# Patient Record
Sex: Female | Born: 1935 | Race: White | Hispanic: No | Marital: Married | Smoking: Never smoker
Health system: Southern US, Community
[De-identification: ages and names within clinical notes are randomized; demographics above are authoritative.]

## PROBLEM LIST (undated history)

## (undated) DIAGNOSIS — K449 Diaphragmatic hernia without obstruction or gangrene: Secondary | ICD-10-CM

## (undated) DIAGNOSIS — K8050 Calculus of bile duct without cholangitis or cholecystitis without obstruction: Secondary | ICD-10-CM

## (undated) DIAGNOSIS — I10 Essential (primary) hypertension: Secondary | ICD-10-CM

## (undated) DIAGNOSIS — Z86010 Personal history of colonic polyps: Secondary | ICD-10-CM

## (undated) DIAGNOSIS — H9190 Unspecified hearing loss, unspecified ear: Secondary | ICD-10-CM

## (undated) DIAGNOSIS — E538 Deficiency of other specified B group vitamins: Secondary | ICD-10-CM

## (undated) DIAGNOSIS — R413 Other amnesia: Secondary | ICD-10-CM

## (undated) DIAGNOSIS — R269 Unspecified abnormalities of gait and mobility: Principal | ICD-10-CM

## (undated) DIAGNOSIS — I2510 Atherosclerotic heart disease of native coronary artery without angina pectoris: Secondary | ICD-10-CM

## (undated) DIAGNOSIS — IMO0002 Reserved for concepts with insufficient information to code with codable children: Secondary | ICD-10-CM

## (undated) DIAGNOSIS — K5790 Diverticulosis of intestine, part unspecified, without perforation or abscess without bleeding: Secondary | ICD-10-CM

## (undated) DIAGNOSIS — Z860101 Personal history of adenomatous and serrated colon polyps: Secondary | ICD-10-CM

## (undated) DIAGNOSIS — H532 Diplopia: Secondary | ICD-10-CM

## (undated) DIAGNOSIS — E785 Hyperlipidemia, unspecified: Secondary | ICD-10-CM

## (undated) DIAGNOSIS — M810 Age-related osteoporosis without current pathological fracture: Secondary | ICD-10-CM

## (undated) DIAGNOSIS — I471 Supraventricular tachycardia, unspecified: Secondary | ICD-10-CM

## (undated) DIAGNOSIS — I2699 Other pulmonary embolism without acute cor pulmonale: Secondary | ICD-10-CM

## (undated) DIAGNOSIS — D509 Iron deficiency anemia, unspecified: Secondary | ICD-10-CM

## (undated) DIAGNOSIS — Z8719 Personal history of other diseases of the digestive system: Secondary | ICD-10-CM

## (undated) DIAGNOSIS — Z7901 Long term (current) use of anticoagulants: Secondary | ICD-10-CM

## (undated) HISTORY — DX: Other pulmonary embolism without acute cor pulmonale: I26.99

## (undated) HISTORY — DX: Long term (current) use of anticoagulants: Z79.01

## (undated) HISTORY — DX: Essential (primary) hypertension: I10

## (undated) HISTORY — DX: Age-related osteoporosis without current pathological fracture: M81.0

## (undated) HISTORY — DX: Reserved for concepts with insufficient information to code with codable children: IMO0002

## (undated) HISTORY — DX: Personal history of adenomatous and serrated colon polyps: Z86.0101

## (undated) HISTORY — DX: Atherosclerotic heart disease of native coronary artery without angina pectoris: I25.10

## (undated) HISTORY — DX: Unspecified abnormalities of gait and mobility: R26.9

## (undated) HISTORY — PX: CARDIAC CATHETERIZATION: SHX172

## (undated) HISTORY — DX: Deficiency of other specified B group vitamins: E53.8

## (undated) HISTORY — DX: Hyperlipidemia, unspecified: E78.5

## (undated) HISTORY — DX: Unspecified hearing loss, unspecified ear: H91.90

## (undated) HISTORY — DX: Other amnesia: R41.3

## (undated) HISTORY — PX: KYPHOSIS SURGERY: SHX114

## (undated) HISTORY — DX: Iron deficiency anemia, unspecified: D50.9

## (undated) HISTORY — DX: Diverticulosis of intestine, part unspecified, without perforation or abscess without bleeding: K57.90

## (undated) HISTORY — DX: Diplopia: H53.2

## (undated) HISTORY — PX: LAMINECTOMY: SHX219

## (undated) HISTORY — DX: Diaphragmatic hernia without obstruction or gangrene: K44.9

## (undated) HISTORY — DX: Calculus of bile duct without cholangitis or cholecystitis without obstruction: K80.50

## (undated) HISTORY — DX: Supraventricular tachycardia: I47.1

## (undated) HISTORY — DX: Supraventricular tachycardia, unspecified: I47.10

## (undated) HISTORY — DX: Personal history of colonic polyps: Z86.010

## (undated) HISTORY — DX: Personal history of other diseases of the digestive system: Z87.19

## (undated) HISTORY — PX: ABDOMINAL HYSTERECTOMY: SHX81

---
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
## 1975-10-01 HISTORY — PX: CYSTOSCOPY: SUR368

## 1998-01-31 ENCOUNTER — Other Ambulatory Visit: Admission: RE | Admit: 1998-01-31 | Discharge: 1998-01-31 | Payer: Self-pay | Admitting: Internal Medicine

## 1999-02-17 ENCOUNTER — Emergency Department (HOSPITAL_COMMUNITY): Admission: EM | Admit: 1999-02-17 | Discharge: 1999-02-17 | Payer: Self-pay | Admitting: Emergency Medicine

## 1999-02-17 ENCOUNTER — Encounter: Payer: Self-pay | Admitting: Emergency Medicine

## 1999-02-18 ENCOUNTER — Encounter: Payer: Self-pay | Admitting: Emergency Medicine

## 1999-10-01 HISTORY — PX: KNEE ARTHROSCOPY: SUR90

## 2000-01-17 ENCOUNTER — Ambulatory Visit (HOSPITAL_COMMUNITY): Admission: RE | Admit: 2000-01-17 | Discharge: 2000-01-17 | Payer: Self-pay | Admitting: *Deleted

## 2000-01-17 ENCOUNTER — Encounter: Payer: Self-pay | Admitting: *Deleted

## 2000-01-31 ENCOUNTER — Ambulatory Visit (HOSPITAL_COMMUNITY): Admission: RE | Admit: 2000-01-31 | Discharge: 2000-01-31 | Payer: Self-pay | Admitting: *Deleted

## 2000-01-31 ENCOUNTER — Encounter: Payer: Self-pay | Admitting: *Deleted

## 2000-02-13 ENCOUNTER — Ambulatory Visit (HOSPITAL_COMMUNITY): Admission: RE | Admit: 2000-02-13 | Discharge: 2000-02-13 | Payer: Self-pay | Admitting: *Deleted

## 2000-02-13 ENCOUNTER — Encounter: Payer: Self-pay | Admitting: *Deleted

## 2000-05-09 ENCOUNTER — Encounter: Payer: Self-pay | Admitting: *Deleted

## 2000-05-13 ENCOUNTER — Inpatient Hospital Stay (HOSPITAL_COMMUNITY): Admission: RE | Admit: 2000-05-13 | Discharge: 2000-05-15 | Payer: Self-pay | Admitting: *Deleted

## 2000-05-13 ENCOUNTER — Encounter: Payer: Self-pay | Admitting: *Deleted

## 2001-12-18 ENCOUNTER — Encounter: Payer: Self-pay | Admitting: Cardiology

## 2001-12-18 ENCOUNTER — Ambulatory Visit (HOSPITAL_COMMUNITY): Admission: RE | Admit: 2001-12-18 | Discharge: 2001-12-18 | Payer: Self-pay | Admitting: Cardiology

## 2003-04-29 ENCOUNTER — Encounter: Payer: Self-pay | Admitting: Cardiology

## 2003-04-29 ENCOUNTER — Inpatient Hospital Stay (HOSPITAL_COMMUNITY): Admission: EM | Admit: 2003-04-29 | Discharge: 2003-05-05 | Payer: Self-pay | Admitting: Emergency Medicine

## 2003-05-02 ENCOUNTER — Encounter (INDEPENDENT_AMBULATORY_CARE_PROVIDER_SITE_OTHER): Payer: Self-pay | Admitting: Cardiology

## 2004-08-22 ENCOUNTER — Inpatient Hospital Stay (HOSPITAL_COMMUNITY): Admission: EM | Admit: 2004-08-22 | Discharge: 2004-08-23 | Payer: Self-pay | Admitting: *Deleted

## 2005-07-18 ENCOUNTER — Encounter: Admission: RE | Admit: 2005-07-18 | Discharge: 2005-07-18 | Payer: Self-pay | Admitting: Orthopedic Surgery

## 2005-09-03 ENCOUNTER — Encounter: Admission: RE | Admit: 2005-09-03 | Discharge: 2005-09-03 | Payer: Self-pay | Admitting: Orthopedic Surgery

## 2005-09-09 ENCOUNTER — Encounter: Admission: RE | Admit: 2005-09-09 | Discharge: 2005-09-09 | Payer: Self-pay | Admitting: Orthopedic Surgery

## 2005-09-24 ENCOUNTER — Encounter: Admission: RE | Admit: 2005-09-24 | Discharge: 2005-09-24 | Payer: Self-pay | Admitting: Orthopedic Surgery

## 2005-09-30 DIAGNOSIS — I2699 Other pulmonary embolism without acute cor pulmonale: Secondary | ICD-10-CM

## 2005-09-30 HISTORY — DX: Other pulmonary embolism without acute cor pulmonale: I26.99

## 2005-09-30 HISTORY — PX: VERTEBROPLASTY: SHX113

## 2005-09-30 HISTORY — PX: OTHER SURGICAL HISTORY: SHX169

## 2005-09-30 HISTORY — PX: CHOLECYSTECTOMY: SHX55

## 2005-10-08 ENCOUNTER — Ambulatory Visit (HOSPITAL_COMMUNITY): Admission: RE | Admit: 2005-10-08 | Discharge: 2005-10-08 | Payer: Self-pay | Admitting: Internal Medicine

## 2005-10-18 ENCOUNTER — Inpatient Hospital Stay (HOSPITAL_COMMUNITY): Admission: EM | Admit: 2005-10-18 | Discharge: 2005-10-27 | Payer: Self-pay | Admitting: Emergency Medicine

## 2005-11-26 ENCOUNTER — Emergency Department (HOSPITAL_COMMUNITY): Admission: EM | Admit: 2005-11-26 | Discharge: 2005-11-26 | Payer: Self-pay | Admitting: Family Medicine

## 2005-12-12 ENCOUNTER — Inpatient Hospital Stay (HOSPITAL_COMMUNITY): Admission: EM | Admit: 2005-12-12 | Discharge: 2005-12-20 | Payer: Self-pay | Admitting: Emergency Medicine

## 2005-12-12 ENCOUNTER — Encounter (INDEPENDENT_AMBULATORY_CARE_PROVIDER_SITE_OTHER): Payer: Self-pay | Admitting: *Deleted

## 2005-12-13 ENCOUNTER — Encounter (INDEPENDENT_AMBULATORY_CARE_PROVIDER_SITE_OTHER): Payer: Self-pay | Admitting: *Deleted

## 2005-12-16 ENCOUNTER — Encounter (INDEPENDENT_AMBULATORY_CARE_PROVIDER_SITE_OTHER): Payer: Self-pay | Admitting: Cardiology

## 2005-12-20 ENCOUNTER — Encounter (INDEPENDENT_AMBULATORY_CARE_PROVIDER_SITE_OTHER): Payer: Self-pay | Admitting: *Deleted

## 2006-01-08 ENCOUNTER — Encounter: Admission: RE | Admit: 2006-01-08 | Discharge: 2006-01-08 | Payer: Self-pay | Admitting: Orthopedic Surgery

## 2006-02-04 ENCOUNTER — Encounter: Admission: RE | Admit: 2006-02-04 | Discharge: 2006-02-04 | Payer: Self-pay | Admitting: Neurological Surgery

## 2006-03-31 ENCOUNTER — Encounter (INDEPENDENT_AMBULATORY_CARE_PROVIDER_SITE_OTHER): Payer: Self-pay | Admitting: *Deleted

## 2006-04-01 ENCOUNTER — Inpatient Hospital Stay (HOSPITAL_COMMUNITY): Admission: RE | Admit: 2006-04-01 | Discharge: 2006-04-02 | Payer: Self-pay | Admitting: Surgery

## 2006-07-08 ENCOUNTER — Encounter: Admission: RE | Admit: 2006-07-08 | Discharge: 2006-07-08 | Payer: Self-pay | Admitting: Neurological Surgery

## 2006-07-16 ENCOUNTER — Encounter (INDEPENDENT_AMBULATORY_CARE_PROVIDER_SITE_OTHER): Payer: Self-pay | Admitting: Specialist

## 2006-07-16 ENCOUNTER — Ambulatory Visit (HOSPITAL_COMMUNITY): Admission: RE | Admit: 2006-07-16 | Discharge: 2006-07-16 | Payer: Self-pay | Admitting: Anesthesiology

## 2006-07-21 ENCOUNTER — Ambulatory Visit (HOSPITAL_COMMUNITY): Admission: RE | Admit: 2006-07-21 | Discharge: 2006-07-21 | Payer: Self-pay | Admitting: Interventional Radiology

## 2006-08-05 ENCOUNTER — Encounter: Admission: RE | Admit: 2006-08-05 | Discharge: 2006-08-05 | Payer: Self-pay | Admitting: Neurological Surgery

## 2006-09-05 ENCOUNTER — Ambulatory Visit: Payer: Self-pay | Admitting: Oncology

## 2006-10-02 LAB — CBC WITH DIFFERENTIAL/PLATELET
Basophils Absolute: 0.1 10*3/uL (ref 0.0–0.1)
EOS%: 1.4 % (ref 0.0–7.0)
HGB: 11.4 g/dL — ABNORMAL LOW (ref 11.6–15.9)
MCH: 24.2 pg — ABNORMAL LOW (ref 26.0–34.0)
NEUT#: 5.8 10*3/uL (ref 1.5–6.5)
RDW: 15.1 % — ABNORMAL HIGH (ref 11.3–14.5)
lymph#: 3 10*3/uL (ref 0.9–3.3)

## 2006-10-03 LAB — SEDIMENTATION RATE: Sed Rate: 13 mm/hr (ref 0–22)

## 2006-10-06 LAB — COMPREHENSIVE METABOLIC PANEL
ALT: 10 U/L (ref 0–35)
AST: 11 U/L (ref 0–37)
Albumin: 4.2 g/dL (ref 3.5–5.2)
Alkaline Phosphatase: 98 U/L (ref 39–117)
Glucose, Bld: 114 mg/dL — ABNORMAL HIGH (ref 70–99)
Potassium: 4.4 mEq/L (ref 3.5–5.3)
Sodium: 141 mEq/L (ref 135–145)
Total Protein: 6.7 g/dL (ref 6.0–8.3)

## 2006-10-06 LAB — BETA 2 MICROGLOBULIN, SERUM: Beta-2 Microglobulin: 2.29 mg/L — ABNORMAL HIGH (ref 1.01–1.73)

## 2006-10-06 LAB — IMMUNOFIXATION ELECTROPHORESIS: Total Protein, Serum Electrophoresis: 6.7 g/dL (ref 6.0–8.3)

## 2006-10-07 LAB — IRON AND TIBC
Iron: 14 ug/dL — ABNORMAL LOW (ref 42–145)
UIBC: 283 ug/dL

## 2006-10-14 ENCOUNTER — Encounter (INDEPENDENT_AMBULATORY_CARE_PROVIDER_SITE_OTHER): Payer: Self-pay | Admitting: *Deleted

## 2006-10-14 ENCOUNTER — Other Ambulatory Visit: Admission: RE | Admit: 2006-10-14 | Discharge: 2006-10-14 | Payer: Self-pay | Admitting: Oncology

## 2006-10-14 LAB — CBC WITH DIFFERENTIAL/PLATELET
Basophils Absolute: 0.1 10*3/uL (ref 0.0–0.1)
EOS%: 1.5 % (ref 0.0–7.0)
Eosinophils Absolute: 0.1 10*3/uL (ref 0.0–0.5)
HCT: 35.9 % (ref 34.8–46.6)
HGB: 11.5 g/dL — ABNORMAL LOW (ref 11.6–15.9)
MCH: 24.2 pg — ABNORMAL LOW (ref 26.0–34.0)
MCV: 75.8 fL — ABNORMAL LOW (ref 81.0–101.0)
MONO%: 8.3 % (ref 0.0–13.0)
NEUT%: 61.3 % (ref 39.6–76.8)
Platelets: 347 10*3/uL (ref 145–400)

## 2006-10-24 ENCOUNTER — Ambulatory Visit (HOSPITAL_COMMUNITY): Admission: RE | Admit: 2006-10-24 | Discharge: 2006-10-24 | Payer: Self-pay | Admitting: Oncology

## 2006-10-27 ENCOUNTER — Ambulatory Visit: Payer: Self-pay | Admitting: Oncology

## 2006-10-30 LAB — CBC WITH DIFFERENTIAL/PLATELET
Basophils Absolute: 0 10*3/uL (ref 0.0–0.1)
EOS%: 1.6 % (ref 0.0–7.0)
LYMPH%: 36 % (ref 14.0–48.0)
MCH: 24.3 pg — ABNORMAL LOW (ref 26.0–34.0)
MCV: 75.2 fL — ABNORMAL LOW (ref 81.0–101.0)
MONO%: 10.9 % (ref 0.0–13.0)
Platelets: 327 10*3/uL (ref 145–400)
RBC: 4.83 10*6/uL (ref 3.70–5.32)
RDW: 17.7 % — ABNORMAL HIGH (ref 11.3–14.5)

## 2006-10-30 LAB — CHCC SMEAR

## 2008-01-07 ENCOUNTER — Emergency Department (HOSPITAL_COMMUNITY): Admission: EM | Admit: 2008-01-07 | Discharge: 2008-01-08 | Payer: Self-pay | Admitting: Emergency Medicine

## 2008-01-08 ENCOUNTER — Encounter (INDEPENDENT_AMBULATORY_CARE_PROVIDER_SITE_OTHER): Payer: Self-pay | Admitting: *Deleted

## 2008-01-08 ENCOUNTER — Inpatient Hospital Stay (HOSPITAL_COMMUNITY): Admission: EM | Admit: 2008-01-08 | Discharge: 2008-01-13 | Payer: Self-pay | Admitting: Emergency Medicine

## 2008-01-12 ENCOUNTER — Encounter: Payer: Self-pay | Admitting: Internal Medicine

## 2008-01-13 ENCOUNTER — Encounter (INDEPENDENT_AMBULATORY_CARE_PROVIDER_SITE_OTHER): Payer: Self-pay | Admitting: *Deleted

## 2008-01-14 ENCOUNTER — Ambulatory Visit: Payer: Self-pay | Admitting: Internal Medicine

## 2008-03-02 DIAGNOSIS — M810 Age-related osteoporosis without current pathological fracture: Secondary | ICD-10-CM | POA: Insufficient documentation

## 2008-03-02 DIAGNOSIS — IMO0002 Reserved for concepts with insufficient information to code with codable children: Secondary | ICD-10-CM | POA: Insufficient documentation

## 2008-03-02 DIAGNOSIS — I2510 Atherosclerotic heart disease of native coronary artery without angina pectoris: Secondary | ICD-10-CM | POA: Insufficient documentation

## 2008-03-02 DIAGNOSIS — Z86718 Personal history of other venous thrombosis and embolism: Secondary | ICD-10-CM | POA: Insufficient documentation

## 2008-03-02 DIAGNOSIS — Z8679 Personal history of other diseases of the circulatory system: Secondary | ICD-10-CM | POA: Insufficient documentation

## 2008-03-02 DIAGNOSIS — E782 Mixed hyperlipidemia: Secondary | ICD-10-CM | POA: Insufficient documentation

## 2008-03-02 DIAGNOSIS — D509 Iron deficiency anemia, unspecified: Secondary | ICD-10-CM

## 2008-03-03 ENCOUNTER — Ambulatory Visit: Payer: Self-pay | Admitting: Internal Medicine

## 2008-03-03 DIAGNOSIS — E538 Deficiency of other specified B group vitamins: Secondary | ICD-10-CM | POA: Insufficient documentation

## 2008-03-04 LAB — CONVERTED CEMR LAB
AST: 19 units/L (ref 0–37)
Bilirubin, Direct: 0.1 mg/dL (ref 0.0–0.3)
HCT: 38 % (ref 36.0–46.0)
MCHC: 33.9 g/dL (ref 30.0–36.0)
Monocytes Absolute: 1.1 10*3/uL — ABNORMAL HIGH (ref 0.1–1.0)
Monocytes Relative: 11 % (ref 3.0–12.0)
Neutro Abs: 5.9 10*3/uL (ref 1.4–7.7)
Neutrophils Relative %: 59.3 % (ref 43.0–77.0)
Platelets: 237 10*3/uL (ref 150–400)
RDW: 23.7 % — ABNORMAL HIGH (ref 11.5–14.6)
Total Bilirubin: 0.7 mg/dL (ref 0.3–1.2)
Total Protein: 6.5 g/dL (ref 6.0–8.3)

## 2009-01-12 ENCOUNTER — Encounter: Payer: Self-pay | Admitting: Internal Medicine

## 2009-07-09 ENCOUNTER — Encounter (INDEPENDENT_AMBULATORY_CARE_PROVIDER_SITE_OTHER): Payer: Self-pay | Admitting: *Deleted

## 2009-07-09 ENCOUNTER — Inpatient Hospital Stay (HOSPITAL_COMMUNITY): Admission: EM | Admit: 2009-07-09 | Discharge: 2009-07-13 | Payer: Self-pay | Admitting: Emergency Medicine

## 2009-07-10 ENCOUNTER — Ambulatory Visit: Payer: Self-pay | Admitting: Gastroenterology

## 2009-07-11 ENCOUNTER — Encounter: Payer: Self-pay | Admitting: Gastroenterology

## 2009-07-12 ENCOUNTER — Encounter: Payer: Self-pay | Admitting: Gastroenterology

## 2009-07-12 ENCOUNTER — Encounter (INDEPENDENT_AMBULATORY_CARE_PROVIDER_SITE_OTHER): Payer: Self-pay | Admitting: *Deleted

## 2009-11-17 ENCOUNTER — Emergency Department (HOSPITAL_COMMUNITY): Admission: EM | Admit: 2009-11-17 | Discharge: 2009-11-17 | Payer: Self-pay | Admitting: Emergency Medicine

## 2009-12-13 ENCOUNTER — Encounter: Payer: Self-pay | Admitting: Internal Medicine

## 2009-12-18 ENCOUNTER — Encounter: Payer: Self-pay | Admitting: Internal Medicine

## 2009-12-19 ENCOUNTER — Telehealth: Payer: Self-pay | Admitting: Internal Medicine

## 2009-12-26 ENCOUNTER — Ambulatory Visit: Payer: Self-pay | Admitting: Internal Medicine

## 2010-02-27 ENCOUNTER — Ambulatory Visit: Payer: Self-pay | Admitting: Internal Medicine

## 2010-02-28 ENCOUNTER — Telehealth: Payer: Self-pay | Admitting: Internal Medicine

## 2010-02-28 LAB — CONVERTED CEMR LAB
Eosinophils Absolute: 0.1 10*3/uL (ref 0.0–0.7)
Hemoglobin: 14.5 g/dL (ref 12.0–15.0)
Iron: 46 ug/dL (ref 42–145)
Lymphs Abs: 1.6 10*3/uL (ref 0.7–4.0)
MCHC: 33.8 g/dL (ref 30.0–36.0)
MCV: 84.3 fL (ref 78.0–100.0)
Monocytes Absolute: 0.8 10*3/uL (ref 0.1–1.0)
Monocytes Relative: 9.5 % (ref 3.0–12.0)
Neutrophils Relative %: 70.5 % (ref 43.0–77.0)
RBC: 5.07 M/uL (ref 3.87–5.11)
Saturation Ratios: 17.1 % — ABNORMAL LOW (ref 20.0–50.0)
WBC: 8.6 10*3/uL (ref 4.5–10.5)

## 2010-03-08 ENCOUNTER — Telehealth: Payer: Self-pay | Admitting: Internal Medicine

## 2010-03-13 ENCOUNTER — Ambulatory Visit: Payer: Self-pay | Admitting: Internal Medicine

## 2010-03-15 ENCOUNTER — Encounter: Payer: Self-pay | Admitting: Internal Medicine

## 2010-06-21 ENCOUNTER — Encounter: Payer: Self-pay | Admitting: Internal Medicine

## 2010-10-21 ENCOUNTER — Encounter: Payer: Self-pay | Admitting: Orthopedic Surgery

## 2010-10-30 NOTE — Letter (Signed)
Summary: Patient Notice- Polyp Results  [facility]  [address] [name], [place] [address]   Phone: [phone]  Fax: [phone]        March 15, 2010 MRN: [id number]    [name] [name] [address] [place], [place]  [address]    Dear Ms. Ramaswamy,  I am pleased to inform you that the colon polyp(s) removed during your recent colonoscopy was (were) found to be benign (no cancer detected) upon pathologic examination.    Should you develop new or worsening symptoms of abdominal pain, bowel habit changes or bleeding from the rectum or bowels, please schedule an evaluation with either your primary care physician or with me.  Additional information/recommendations:  _x_ No further action with gastroenterology is needed at this time. Please      follow-up with your primary care physician for your other healthcare      needs.  __ Please call [phone] to schedule a return visit to review your      situation.  __ Please keep your follow-up visit as already scheduled.  __ Continue treatment plan as outlined the day of your exam.  Please call us if you are having persistent problems or have questions about your condition that have not been fully answered at this time.  Sincerely,  Hart Carwin MD  This letter has been electronically signed by your physician.  Appended Document: Patient Notice- Polyp Results letter mailed.

## 2010-10-30 NOTE — Procedures (Signed)
Summary: Colonoscopy  Patient: [name] Note: All result statuses are Final unless otherwise noted.  Tests: (1) Colonoscopy (COL)   COL Colonoscopy           DONE     [facility]     [address]     [place], [place]  [address]           COLONOSCOPY PROCEDURE REPORT           PATIENT:  [name], [name]  MR#:  [id number]     BIRTHDATE:  Jan 16, 1923, 86 yrs. old  GENDER:  female     ENDOSCOPIST:  Hedwig Morton. Juanda Chance, MD     REF. BY:  Lucky Cowboy, M.D.     PROCEDURE DATE:  03/13/2010     PROCEDURE:  Colonoscopy [address]     ASA CLASS:  Class II     INDICATIONS:  heme positive stool, iron deficiency anemia Coumadin     for PE on hold x 5 days     MEDICATIONS:   Versed 4 mg, Fentanyl 50 mcg           DESCRIPTION OF PROCEDURE:   After the risks benefits and     alternatives of the procedure were thoroughly explained, informed     consent was obtained.  Digital rectal exam was performed and     revealed no rectal masses.   The LB [id number]-[id number] endoscope     was introduced through the anus and advanced to the cecum, which     was identified by both the appendix and ileocecal valve, without     limitations.  The quality of the prep was good, using MiraLax.     The instrument was then slowly withdrawn as the colon was fully     examined.     <<PROCEDUREIMAGES>>           FINDINGS:  There were multiple polyps identified and removed.     throughout the colon. large 2 cm sessile polyp in the right colon     removed in 2 pieces,     4 diminutive polyps removed from the cecum     1 8 mm polyp removed from rectum The polyps were removed using     cold biopsy forceps. Polyp was snared without cautery. Retrieval     was successful. snare polyp Polyp was snared, then cauterized with     monopolar cautery. Retrieval was successful (see image4, image7,     image8, image10, image9, image11, image14, and image15). snare     polyp  Moderate diverticulosis was found in the sigmoid colon  (see     image2 and image1).  This was otherwise a normal examination of     the colon (see image3, image5, and image6).   Retroflexed views in     the rectum revealed no abnormalities.    The scope was then     withdrawn from the patient and the procedure completed.           COMPLICATIONS:  None     ENDOSCOPIC IMPRESSION:     1) Polyps, multiple throughout the colon     2) Moderate diverticulosis in the sigmoid colon     3) Otherwise normal examination     s/p multiple polypectomies     RECOMMENDATIONS:     1) Await pathology results     hold Coumadin x 14 days     REPEAT EXAM:  In 0 year(s) for.  no recall due to age           ______________________________     Hedwig Morton. Juanda Chance, MD           CC:           n.     eSIGNED:   Hedwig Morton. Milanie Rosenfield at 03/13/2010 02:41 PM           Page 2 of 3   [name], [name], [phone]  Note: An exclamation mark (!) indicates a result that was not dispersed into the flowsheet. Document Creation Date: 03/13/2010 2:43 PM _______________________________________________________________________  (1) Order result status: Final Collection or observation date-time: 03/13/2010 14:25 Requested date-time:  Receipt date-time:  Reported date-time:  Referring Physician:   Ordering Physician: Lina Sar [phone]) Specimen Source:  Source: [name] Order Number: [id number] Lab site:

## 2010-10-30 NOTE — Progress Notes (Signed)
Summary: TRIAGE-ANEMIA  Phone Note From Other Clinic Call back at [facility] Phone [phone]   Caller: Aram Beecham @ Dr Kathryne Sharper office [phone] Call For: Dr Juanda Chance Reason for Call: Schedule Patient Appt Summary of Call: Iron level is low. Negative hemacult cards. Woul dlike pt to be sen sooner than next available on 01-24-10. PA would be ok. Call back to pt is ok. Initial call taken by: Leanor Kail [facility],  December 19, 2009 4:08 PM  Follow-up for Phone Call        Appt. scheduled with pt. husband for 12-26-09 at 4pm. Aram Beecham w/Dr.McKeown notified of appt. She will fax records to [phone].  Follow-up by: Laureen Ochs LPN,  December 19, 2009 4:16 PM

## 2010-10-30 NOTE — Miscellaneous (Signed)
Summary: rx for pepcid  Clinical Lists Changes  Medications: Added new medication of PEPCID 40 MG  TABS (FAMOTIDINE) one tab daily 30 minutes prior to breakfast - Signed Rx of PEPCID 40 MG  TABS (FAMOTIDINE) one tab daily 30 minutes prior to breakfast;  #30 x 6;  Signed;  Entered by: Oda Cogan RN;  Authorized by: Hart Carwin MD;  Method used: Electronically to [facility]  [phone]*, [address] [place], [place], [place]  [address], Ph: [phone] or [phone], Fax: [phone]    Prescriptions: PEPCID 40 MG  TABS (FAMOTIDINE) one tab daily 30 minutes prior to breakfast  #30 x 6   Entered by:   Oda Cogan RN   Authorized by:   Hart Carwin MD   Signed by:   Oda Cogan RN on 03/13/2010   Method used:   Electronically to        [facility]  [phone]* (retail)       [address]       [place], [place]  [address]       Ph: [phone] or [phone]       Fax: [phone]   RxID:   [id number]

## 2010-10-30 NOTE — Progress Notes (Signed)
Summary: Triage  Phone Note Call from Patient Call back at Home Phone [phone]   Caller: Patient Call For: Dr. Juanda Chance Reason for Call: Talk to Nurse Summary of Call: pt is requesting to speak directly with nurse Initial call taken by: Karna Christmas,  March 08, 2010 9:24 AM  Follow-up for Phone Call        Pt. is scheduled for an Endo/Colon on 03-13-10. Per pt. husband, pt. is very concerned she won't get completely cleaned out for her Colon/Endo. She doesn't think she can drink the prep, she doesn't drink alot and pt. "Is worrying herself to death she can't drink all that stuff and she don't want to be charged all those fees if she can't do it. What are we gonna do?"  I asked Mr.Ethington to be supportive and encouraging to pt.,  and offered that pt. can be on a clear liquid diet the day before she starts her prep, to help make sure she will be well prepped. Mr.Becka doesn't feel this is a good option and continues to ask, 'Well, what are we gonna do?"   DR.BRODIE PLEASE ADVISE  Follow-up by: Laureen Ochs LPN,  March 08, 4781 9:35 AM  Additional Follow-up for Phone Call Additional follow up Details #1::        She ought to be on full liquids, like Ensure 1 can three times a day and other full liquids, for 2 days prior to prep. day. She can take Mag. citrate 1 bottle  3 days and 2 days prior to the exam and then the routine prep  the day before the exam. Additional Follow-up by: Hart Carwin MD,  March 08, 2010 12:15 PM    Additional Follow-up for Phone Call Additional follow up Details #2::    Above MD orders reviewed with patient's husband. Pt. instructed to call back as needed.  Follow-up by: Laureen Ochs LPN,  March 08, 9561 1:45 PM

## 2010-10-30 NOTE — Letter (Signed)
Summary: Patient [facility] Biopsy Results  [facility]  [address] [place], [place] [address]   Phone: [phone]  Fax: [phone]        March 15, 2010 MRN: [id number]    MARKEE REMLINGER [address] [place], [place]  [address]    Dear Ms. Hohman,  I am pleased to inform you that the biopsies taken during your recent endoscopic examination did not show any evidence of cancer upon pathologic examination.  Additional information/recommendations:  __No further action is needed at this time.  Please follow-up with      your primary care physician for your other healthcare needs.  __ Please call [phone] to schedule a return visit to review      your condition.  _x_ Continue with the treatment plan as outlined on the day of your      exam.  _   Please call us if you are having persistent problems or have questions about your condition that have not been fully answered at this time.  Sincerely,  Hart Carwin MD  This letter has been electronically signed by your physician.  Appended Document: Patient Notice-Endo Biopsy Results letter mailed.

## 2010-10-30 NOTE — Discharge Summary (Signed)
Summary: Retained CBC Stone  NAMELORAINA, [name]                  ACCOUNT NO.:  [id number]      MEDICAL RECORD NO.:  [id number]          PATIENT TYPE:  INP      LOCATION:  5121                         FACILITY:  [facility]      PHYSICIAN:  Lonia Blood, M.D.       DATE OF BIRTH:  1922/12/02      DATE OF ADMISSION:  01/08/2008   DATE OF DISCHARGE:  01/13/2008                                  DISCHARGE SUMMARY      PRIMARY CARE PHYSICIAN:  Lucky Cowboy, MD      DISCHARGE DIAGNOSES:   1. Retained common bile duct stone with cholangitis - status post ERCP       and stone extraction.   2. Coronary artery disease status post stenting.   3. History of a massive pulmonary embolus in January 2007, on chronic       Coumadin.   4. Osteoporosis.   5. History of laminectomy.   6. History of vertebroplasty for compression fractures.   7. Hyperlipidemia.      DISCHARGE MEDICATIONS:   1. Coumadin 5 mg at 6 p.m. daily, until seen by the Coumadin Clinic at       Dr. Fredirick Maudlin office.   2. Diltiazem 240 mg daily.   3. Zocor 80 mg at bedtime.   4. Vitamin D 1000 mg daily.   5. Calcium daily.   6. Aspirin 81 mg daily.   7. Forteo injections daily.   8. Darvocet as needed for pain.   9. Tranxene as needed for anxiety.      CONDITION ON DISCHARGE:  [name] is discharged in good condition.  She   is instructed to follow up as follows:   1. On January 15, 2008, Dr. Gaspar Garbe [place] office for PT/INR check.   2. Dr. Lucky Cowboy, the patient's primary care physician on January 20, 2008 at 2:45 p.m. for a clinical exam and followup of the liver       function tests.   3. Dr. Lina Sar with [facility].  The patient to call       [phone] in May to schedule an appointment.  The patient will       follow up for anemia and also for possibility of further workup of       the elevated CA 19-9.      PROCEDURES:  Procedures during this admission:   1. January 08, 2008, the patient  underwent a CT scan of abdomen and       pelvis with findings of dilated intrahepatic ducts and common bile       duct as well as questionable low attenuation within the neck and       body of the pancreas.  No calcified gallstones.   2. January 12, 2008, ERCP with sphincterotomy by Dr. Lina Sar.      CONSULTATION DURING THIS ADMISSION:  Dr. Juanda Chance from Gastroenterology.      HISTORY AND PHYSICAL:  Refer to the  dictated H&P, which was done by Dr.   [name] Beat on January 08, 2008.  Briefly, [name] presented to the   emergency room with complaints of sharp epigastric pain with radiation   to the back as well as nausea and vomiting.  She was found to have   elevated AST and ALT and a dilated common bile duct on abdominal   ultrasound.  She was referred for further workup and observation.      HOSPITAL COURSE:   1. Cholangitis.  [name] was admitted to Acute Care Unit of [facility].  She was placed on intravenous fluids with       symptomatic treatment.  Her Coumadin was held.  The patient was       started on empiric antibiotics, and she had a CT scan of her       abdomen and pelvis, which did not indicate presence of a massive       pancreatic mass.  [name] was further observed and treated       finally with an ERCP and sphincterotomy on January 12, 2008.  Post-       procedure, the patient did well without any signs of acute       pancreatitis.  The patient will follow up with her primary care       physician to monitor the trend of the LFTs, but by the time of the       discharge, the only abnormal LFT is the ALT of 102.   2. History of pulmonary emboli.  [name] has been continued on the       Coumadin beyond her 1 year.  For this reason, we will place the       patient back on Coumadin at the time of the discharge.  She is       discharged with an INR of 1.2 and instructed to take Coumadin at 5       mg daily and to follow up with Dr. Clarene Duke for further  adjustments.   3. Iron deficiency anemia.  [name] was started on a proton pump       inhibitor as well as iron tablets.  She will follow up with Dr.       Lina Sar for constellation of EGD colonoscopy in the outpatient       setting.   4. Elevated CA 19-9.  I have discussed this with Dr. Lina Sar, and       she felt that this was related to the acute illness.  She will       follow up in the office in regards to further workup, if the CA 19-       9 remains elevated.  Currently, we do not have clinical and       radiological data to strongly suspect the pancreatic cancer.               Lonia Blood, M.D.   Electronically Signed            SL/MEDQ  D:  01/13/2008  T:  01/14/2008  Job:  [id number]      cc:   Lucky Cowboy, M.D.   Thereasa Solo. Little, M.D.   Hedwig Morton. Juanda Chance, MD

## 2010-10-30 NOTE — Progress Notes (Signed)
Summary: results request  Phone Note Call from Patient Call back at Home Phone [phone]   Caller: husband Call For: Dr. Juanda Chance Reason for Call: Talk to Nurse Summary of Call: would like labwork results Initial call taken by: Vallarie Mare,  February 28, 2010 3:40 PM  Follow-up for Phone Call        I have spoken to patient's husband and have explained that patient's blood count has come back up, however her iron saturation is still too low. I have told him that we will have to go through with the endo/colon to assess the problem. He verbalizes understanding and states that he will tell his wife. Follow-up by: Lamona Curl CMA Duncan Dull),  February 28, 2010 5:01 PM

## 2010-10-30 NOTE — Discharge Summary (Signed)
Summary: Acute Cholecystitis   NAME:  [name], [name]                  ACCOUNT NO.:  [id number]   MEDICAL RECORD NO.:  [id number]          PATIENT TYPE:  INP   LOCATION:  2010                         FACILITY:  [facility]   PHYSICIAN:  Nelma Rothman, MD   DATE OF BIRTH:  06/16/1923   DATE OF ADMISSION:  12/12/2005  DATE OF DISCHARGE:  12/20/2005                                 DISCHARGE SUMMARY   PRIMARY CARE PHYSICIAN:  Dr. Oneta Rack.   CONSULTING PHYSICIAN:  Dr. Clarene Duke of [facility]  Dr. Ezzard Standing and Dr. Harriette Bouillon of [facility].   DISCHARGE DIAGNOSES:  1.  Acute cholecystitis.  2.  Biliary pancreatitis.  3.  Escherichia coli secondary to above.  4.  Recent history of pulmonary embolus January2007 on Coumadin.  5.  Hypertension.  6.  Coronary artery disease.  7.  Dyslipidemia.  8.  History of paroxysmal supraventricular tachycardia.   PROCEDURES:  1.  On admission, portable chest x-ray demonstrated chronic elevation of the      right hemidiaphragm but no acute findings.  2.  Ultrasound of the abdomen on admission demonstrated no definite      gallbladder abnormality or other acute findings, although prominence of      the gallbladder wall was noted and the study was somewhat limited      secondary to body habitus and large amount of bowel gas, recommending CT      for further evaluation.  3.  CT of the abdomen and pelvis without contrast on March15, 2007      demonstrated small right pleural effusion and basilar atelectasis as      well as peripancreatic ascites, biliary dilatation, mild changes of      pancreatitis, extensive inflammatory process in the right upper      quadrant, question acute cholecystitis and can not exclude gangrenous      cholecystitis and right upper quadrant abscess. There was also duodenal      wall thickening which was possibly reactive in nature and sigmoid      diverticulosis.  4.  Placement of  percutaneous cholecystostomy tube by interventional      radiology on March17, 2007.  5.  Serial portable chest x-rays demonstrated bibasilar atelectasis and      right-sided pleural effusion.   LABORATORY DATA:  White blood cell count initially was 8.2 and then rose to  24.4 the morning following admission.  It was trending down until she was  started on steroids for reactive airway disease at which time it started  trending up again. White blood cell count on the morning of discharge was  16.0. INR the morning of discharge was 2.4, creatinine 1.1. On admission,  lipase 1129. Liver function tests had demonstrated total bilirubin of 2.0,  alkaline phosphatase 89, AST 641, ALT 344, all of which are trending down at  the time of dictation. Total cholesterol 94, triglycerides 64, HDL 44, LDL  37. TSH was 1.883. CEA was 3.6. Gallbladder fluid culture demonstrated and  multiple organisms present but none predominant.  Blood cultures did grow E-  coli which was sensitive to Zosyn as well as ciprofloxacin.   HISTORY AND PHYSICAL:  See dictated admission history and physical, but  briefly, [name] is a very pleasant 77 year old lady who presented to the  [facility] Emergency Department with abdominal pain and nausea. Liver  function tests were concerning for biliary pancreatitis in addition to acute  cholecystitis. She was admitted for further evaluation and management.   HOSPITAL COURSE:  [name] was admitted to the hospital and started on  intravenous Zosyn. Surgery consult was promptly called for evaluation of  above-noted CT scan findings. They felt that her presentation was most  consistent with acute cholecystitis and superimposed biliary pancreatitis.  Given the severity of illness at that time, they felt it was most  appropriate for a biliary drain to be placed by interventional radiology  which was done on March17, 2007. She was aggressively IV fluid resuscitated  in the meantime.  Since biliary drain was placed, she has remained completely  hemodynamically stable and afebrile. White blood cell count was trending  down prior to initiation of steroids which will be discussed below. The  patient will be discharged today with a biliary drain in place. She will  follow up with Dr. Ezzard Standing in the [facility] in  approximately 2-4 weeks for discussion of outpatient cholecystectomy. Her  biliary drain is to stay in place until follow up with Dr. Ezzard Standing and will  be managed primarily by a home health nurse. She will be discharged on an  additional seven days of oral ciprofloxacin to complete a 14-day course of  antibiotics. With regard to her pancreatitis, it improved dramatically  within 48 hours after admission, and she was started on a clear liquid diet  which was then advanced as tolerated.   [name] did develop some respiratory distress few days after admission.  Chest x-ray at that time demonstrated bibasilar atelectasis and a right-  sided pleural effusion but no overt congestive heart failure. A BNP was  mildly elevated, yet she had no clinical improvement in her shortness of  breath with intravenous Lasix. There was diffuse wheezing on exam. This was  felt most likely consistent with reactive airway disease and she was started  on a burst of Solu-Medrol. The following morning, she was markedly improved  and wheezing was only mild.  She will be discharged to complete a taper of  prednisone over the next six days as prescribed.   With regard to her history of coronary artery disease, [facility], Dr. Clarene Duke and his colleagues, followed along while [name]  was in the hospital with regard to her cardiac status and fluid management.  She was maintained on diltiazem and metoprolol during her stay. She has  tolerated this well. There have been no changes in her chronic cardiac  medications and she will resume all them as  previously prescribed.  [name] [name] did have a recently diagnosed pulmonary embolism approximately  two months ago. Her Coumadin was initially held on admission secondary to  concern for an emergent cholecystectomy. She was placed on unfractionated  heparin drip on which she was continued for a few days after percutaneous  drain placement when it became clear that surgery could be postponed until  an outpatient. At that time, she was restarted on Coumadin and today her INR  is 2.4. Since she is taking Cipro, I suspect that her Coumadin dose will be  lower then she was taking previously. For that reason, I have asked her to  take 2.5 milligrams again tonight, then no Coumadin on Saturday, March24th,  then 2.5 milligrams on Sunday March25th, and then the home health nurse will  be drawing an INR level and with results faxed to Dr. Clarene Duke on Monday,  March26th. Her previous dose was 2.5 milligrams every day of the week except  two days when she took 5 milligrams. However, it has only taken her three  days to get to an INR of 2.4, so I suspect that her Coumadin requirements  will be substantially lower for the time being while she is on Cipro, hence  I have asked her to hold her Coumadin on Saturday so as not to cause a  supratherapeutic INR.   DISCHARGE MEDICATIONS:  1.  Ciprofloxacin 500 milligrams p.o. b.i.d. x7 days.  2.  Fluconazole 100 milligrams p.o. daily x7 days.  3.  Prednisone Dosepak 6 days 10 mg taper 60 to 10 milligrams over the      course of 6 days.  4.  She is to resume all of her home medications as previous.   DISCHARGE INSTRUCTIONS:  She will be discharged home today with home health  physical therapy, occupational therapy, nurse for management of her biliary  drain as well as an aide. She is instructed to flush her  biliary drain at least daily and record output. The drain should remain in  place until she follows up with Dr. Ezzard Standing in approximately two to four  weeks.  She is to call [phone] to make this appointment. She will also  follow up with Dr. Oneta Rack, her primary care provider, within the next few  weeks for hospital follow-up.      Nelma Rothman, MD  Electronically Signed     RAR/MEDQ  D:  12/20/2005  T:  12/21/2005  Job:  [id number]   cc:   Lucky Cowboy, M.D.  Fax: [phone]   Thereasa Solo. Little, M.D.  Fax: [phone]   Clovis Pu. Cornett, M.D.  [address] [place] [address]  [place] [address]

## 2010-10-30 NOTE — Assessment & Plan Note (Signed)
Summary: 8 week f/u   History of Present Illness Visit Type: Follow-up Visit Primary GI MD: Lina Sar MD Primary Provider: Lucky Cowboy, MD Requesting Provider: na Chief Complaint: F/u for anemia. Pt c/o fatigue but getting better and denies any other complaints  History of Present Illness:   This is an 77 year old white female with Hemoccult positive stool and iron deficiency anemia. She was initially seen on 12/26/09 for this but she did not want to have a colonoscopy or endoscopy pending trial of iron supplements. She has been on Coumadin for a history of pulmonary embolus. There is a history of choledocholithiasis for which she is status post ERCP/ sphincterotomy x 2. She comes today for followup of heme positive stool and anemia. She has  been  on  iron supplements. She denies any GI symptoms.   GI Review of Systems      Denies abdominal pain, acid reflux, belching, bloating, chest pain, dysphagia with liquids, dysphagia with solids, heartburn, loss of appetite, nausea, vomiting, vomiting blood, weight loss, and  weight gain.        Denies anal fissure, black tarry stools, change in bowel habit, constipation, diarrhea, diverticulosis, fecal incontinence, heme positive stool, hemorrhoids, irritable bowel syndrome, jaundice, light color stool, liver problems, rectal bleeding, and  rectal pain.    Current Medications (verified): 1)  Coumadin 2.5 Mg  Tabs (Warfarin Sodium) .... As D[facility] 2)  Vitamin D 1000 Unit  Tabs (Cholecalciferol) .... Take 1 Tablet By Mouth Once A Day 3)  Aspirin 81 Mg  Tbec (Aspirin) .... Take 1 Tablet By Mouth Once A Day 4)  Calcitrate/vitamin D 315-200 Mg-Unit  Tabs (Calcium Citrate-Vitamin D) .... One By Mouth Once Daily 5)  Flurazepam Hcl 30 Mg  Caps (Flurazepam Hcl) .... One By Mouth At Bedtime 6)  Cardizem Cd 120 Mg  Cp24 (Diltiazem Hcl Coated Beads) .... One By Mouth Once Daily 7)  Vitamin D3 2000 Unit  Caps (Cholecalciferol) ....  Take Two By Mouth Once Daily 8)  Pravastatin Sodium 20 Mg Tabs (Pravastatin Sodium) .Marland Kitchen.. 1 Tablet By Mouth Once Daily 9)  Clorazepate Dipotassium 7.5 Mg Tabs (Clorazepate Dipotassium) .Marland Kitchen.. 1 Tablet By Mouth Once Daily 10)  Ferrous Sulfate 325 (65 Fe) Mg Tabs (Ferrous Sulfate) .... Take 1 Tablet By Mouth Three Times A Day  Allergies (verified): No Known Drug Allergies  Past History:  Past Medical History: Reviewed history from 03/03/2008 and no changes required. Current Problems:  B12 DEFICIENCY (ICD-266.2) MYOCARDIAL INFARCTION, HX OF (ICD-412) DEGENERATIVE DISC DISEASE (ICD-722.6) SUPRAVENTRICULAR TACHYCARDIA, PAROXYSMAL, HX OF (ICD-V12.59) ANEMIA, IRON DEFICIENCY (ICD-280.9) HYPERLIPIDEMIA (ICD-272.4) OSTEOPOROSIS (ICD-733.00) PULMONARY EMBOLISM, HX OF (ICD-V12.51) CORONARY ARTERY DISEASE (ICD-414.00) CHOLEDOCHOLITHIASIS, HX OF (ICD-V12.79) Hx of CHOLANGITIS (ICD-576.1) Hx of PANCREATITIS, HX OF (ICD-V12.70)  Past Surgical History: Reviewed history from 03/02/2008 and no changes required. kypphoplasty vertebroplasty laminectomy sphencterotomy with stone extraciton-pancreas  Family History: Family History of Diabetes: son No FH of Colon Cancer:  Social History: Reviewed history from 03/03/2008 and no changes required. Occupation: retired Patient has never smoked.  Alcohol Use - no Illicit Drug Use - no Patient does not get regular exercise.   Review of Systems       The patient complains of fatigue.  The patient denies allergy/sinus, anemia, anxiety-new, arthritis/joint pain, back pain, blood in urine, breast changes/lumps, change in vision, confusion, cough, coughing up blood, depression-new, fainting, fever, headaches-new, hearing problems, heart murmur, heart rhythm changes, itching, menstrual pain, muscle pains/cramps, night sweats, nosebleeds, pregnancy symptoms, shortness  of breath, skin rash, sleeping problems, sore throat, swelling of feet/legs, swollen lymph  glands, thirst - excessive , urination - excessive , urination changes/pain, urine leakage, vision changes, and voice change.    Vital Signs:  Patient profile:   77 year old female Height:      61 inches Weight:      207 pounds BMI:     39.25 BSA:     1.92 Pulse rate:   68 / minute Pulse rhythm:   regular BP sitting:   136 / 82  (left arm) Cuff size:   regular  Vitals Entered By: Ok Anis CMA (Feb 27, 2010 1:30 PM)  Physical Exam  General:  patient travels in a wheelchair. She can ambulate with help. She is alert and oriented. Eyes:  PERRLA, no icterus. Mouth:  No deformity or lesions, dentition normal. Neck:  Supple; no masses or thyromegaly. Lungs:  Clear throughout to auscultation. Heart:  Regular rate and rhythm; no murmurs, rubs,  or bruits. Abdomen:  obese soft abdomen with normoactive bowel sounds. Nontender. Rectal:  Royals, strongly Hemoccult positive stool. Extremities:  trace edema. Skin:  multiple ecchymoses of the lower extremities. Psych:  Alert and cooperative. Normal mood and affect.   Impression & Recommendations:  Problem # 1:  ANEMIA, IRON DEFICIENCY (ICD-280.9) Patient has been on iron supplements for the past 2 months. We will repeat a CBC today. She remains Hemoccult positive and therefore I again recommend for her to have upper endoscopy and colonoscopy. Will schedule her for it. The major concern here is colon cancer. She may also have AVMs, large colon polyps and possibly have a hernia with Cameron erosions.  Orders: Colon/Endo (Colon/Endo) TLB-CBC Platelet - w/Differential (85025-CBCD) TLB-IBC Pnl (Iron/FE;Transferrin) (83550-IBC)  Problem # 2:  PULMONARY EMBOLISM, HX OF (ICD-V12.51) Patient is on long-term Coumadin. We will still discontinue Coumadin for 5 days prior to the endoscopic procedures.  Patient Instructions: 1)  upper endoscopy and colonoscopy. 2)  Hold Coumadin 5 days prior to the procedures. 3)  CBC, IBC today. 4)  Copy sent to  : Dr Oneta Rack 5)  The medication list was reviewed and reconciled.  All changed / newly prescribed medications were explained.  A complete medication list was provided to the patient / caregiver. Prescriptions: DULCOLAX 5 MG  TBEC (BISACODYL) Day before procedure take 2 at 3pm and 2 at 8pm.  #4 x 0   Entered by:   Lamona Curl CMA (AAMA)   Authorized by:   Hart Carwin MD   Signed by:   Lamona Curl CMA (AAMA) on 02/27/2010   Method used:   Electronically to        [facility]  [phone]* (retail)       [address]       [place], [place]  [address]       Ph: [phone] or [phone]       Fax: [phone]   RxID:   [id number] REGLAN 10 MG  TABS (METOCLOPRAMIDE HCL) As per prep instructions.  #2 x 0   Entered by:   Lamona Curl CMA (AAMA)   Authorized by:   Hart Carwin MD   Signed by:   Lamona Curl CMA (AAMA) on 02/27/2010   Method used:   Electronically to        [facility]  [phone]* (retail)       [phone]  [place], [place]  [address]       Ph: [phone] or [phone]       Fax: [phone]   RxID:   [id number] MIRALAX   POWD (POLYETHYLENE GLYCOL 3350) As per prep  instructions.  #255gm x 0   Entered by:   Lamona Curl CMA (AAMA)   Authorized by:   Hart Carwin MD   Signed by:   Lamona Curl CMA (AAMA) on 02/27/2010   Method used:   Electronically to        [facility]  [phone]* (retail)       [address]       [place], [place]  [address]       Ph: [phone] or [phone]       Fax: [phone]   RxID:   [id number]   Appended Document: 8 week f/u ---- 02/28/2010 12:34 PM, Karen Kitchens Nelson-Smith CMA (AAMA) wrote: Patient told me that she normally only holds her coumadin 3 days prior to surgeries/procedures. Would you like her to hold coumadin the standard 5 days before procedure or shall  I adjust the instructions to d/c 3 days prior to test?  ---- 02/28/2010 12:48 PM, Hart Carwin MD wrote: please hold Coumadin for 5 days, we will not have to recheck her INR on the day of the procedure. Also, different procedures require different Coumadin guidelines for holding it.

## 2010-10-30 NOTE — Procedures (Signed)
Summary: Upper Endoscopy  Patient: [name] [name] Note: All result statuses are Final unless otherwise noted.  Tests: (1) Upper Endoscopy (EGD)   EGD Upper Endoscopy       DONE     [facility]     [address]     [place], [place]  [address]           ENDOSCOPY PROCEDURE REPORT           PATIENT:  [name], [name]  MR#:  [id number]     BIRTHDATE:  1923-01-09, 86 yrs. old  GENDER:  female           ENDOSCOPIST:  Hedwig Morton. Juanda Chance, MD     Referred by:  Lucky Cowboy, M.D.           PROCEDURE DATE:  03/13/2010     PROCEDURE:  EGD with biopsy     ASA CLASS:  Class II     INDICATIONS:  hemeoccult positive stool, iron deficiency anemia pt     on Coumadin for PE, D/C'ed 5 days ago           MEDICATIONS:   Versed 4 mg, Fentanyl 25 mcg     TOPICAL ANESTHETIC:  Exactacain Spray           DESCRIPTION OF PROCEDURE:   After the risks benefits and     alternatives of the procedure were thoroughly explained, informed     consent was obtained.  The LB GIF-H180 [id number] endoscope was     introduced through the mouth and advanced to the second portion of     the duodenum, without limitations.  The instrument was slowly     withdrawn as the mucosa was fully examined.     <<PROCEDUREIMAGES>>           Moderate gastritis was found in the body and the antrum of the     stomach. erosions in the body and the antrum With standard     forceps, a biopsy was obtained and sent to pathology (see image1,     image3, image4, image6, and image5).  The duodenal bulb was normal     in appearance, as was the postbulbar duodenum. r/o villous atrophy     With standard forceps, a biopsy was obtained and sent to pathology     (see image2).  A hiatal hernia was found (see image7). small 1 cm     HH    Retroflexed views revealed no abnormalities.    The scope     was then withdrawn from the patient and the procedure completed.           COMPLICATIONS:  None           ENDOSCOPIC IMPRESSION:     1) Moderate  gastritis in the body and the antrum of the stomach           2) Normal duodenum     3) Hiatal hernia     no active bleeding off Coumadin, but the erosions could be a     source of GI blood loss when she is on Coumadin     RECOMMENDATIONS:     1) Await biopsy results     Pepcid 40 mg po qd, # 30, 6 refills           REPEAT EXAM:  In 0 year(s) for.           ______________________________     Hedwig Morton. Juanda Chance, MD  CC:           n.     eSIGNED:   Rosealie Reach M. Tecora Eustache at 03/13/2010 02:34 PM           Garnette Czech, [phone]  Note: An exclamation mark (!) indicates a result that was not dispersed into the flowsheet. Document Creation Date: 03/13/2010 2:35 PM _______________________________________________________________________  (1) Order result status: Final Collection or observation date-time: 03/13/2010 13:45 Requested date-time:  Receipt date-time:  Reported date-time:  Referring Physician:   Ordering Physician: Lina Sar [phone]) Specimen Source:  Source: [name] Order Number: [id number] Lab site:   Appended Document: Upper Endoscopy Egd and colon no recall

## 2010-10-30 NOTE — Letter (Signed)
Summary: [facility] Instructions  [facility]  [address] [place], [place] [address]   Phone: [phone]  Fax: [phone]       [name] [name]    25-Sep-1923    MRN: [id number]       Procedure Day /Date: Tuesday 03/13/10     Arrival Time: 12:30 pm     Procedure Time: 1:30 pm     Location of Procedure:                    _x _  [facility] (4th Floor)  PREPARATION FOR COLONOSCOPY WITH MIRALAX  Starting 5 days prior to your procedure (03/08/10) do not eat nuts, seeds, popcorn, corn, beans, peas,  salads, or any raw vegetables.  Do not take any fiber supplements (e.g. Metamucil, Citrucel, and Benefiber). ____________________________________________________________________________________________________   THE DAY BEFORE YOUR PROCEDURE         DATE: 03/12/10 DAY: Monday  1   Drink clear liquids the entire day-NO SOLID FOOD  2   Do not drink anything colored red or purple.  Avoid juices with pulp.  No orange juice.  3   Drink at least 64 oz. (8 glasses) of fluid/clear liquids during the day to prevent dehydration and help the prep work efficiently.  CLEAR LIQUIDS INCLUDE: Water Jello Ice Popsicles Tea (sugar ok, no milk/cream) Powdered fruit flavored drinks Coffee (sugar ok, no milk/cream) Gatorade Juice: apple, white grape, white cranberry  Lemonade Clear bullion, consomm, broth Carbonated beverages (any kind) Strained chicken noodle soup Hard Candy  4   Mix the entire bottle of Miralax with 64 oz. of Gatorade/Powerade in the morning and put in the refrigerator to chill.  5   At 3:00 pm take 2 Dulcolax/Bisacodyl tablets.  6   At 4:30 pm take one Reglan/Metoclopramide tablet.  7  Starting at 5:00 pm drink one 8 oz glass of the Miralax mixture every 15-20 minutes until you have finished drinking the entire 64 oz.  You should finish drinking prep around 7:30 or 8:00 pm.  8   If you are nauseated, you may take the 2nd Reglan/Metoclopramide tablet at  6:30 pm.        9    At 8:00 pm take 2 more DULCOLAX/Bisacodyl tablets.        THE DAY OF YOUR PROCEDURE      DATE:  03/13/10 DAY: Tuesday  You may drink clear liquids until 11:30 am  (2 HOURS BEFORE PROCEDURE).   MEDICATION INSTRUCTIONS  Unless otherwise instructed, you should take regular prescription medications with a small sip of water as early as possible the morning of your procedure.  Please discontinue your Coumadin/warfarin 5 days prior (03/08/10 Thursday) to your scheduled procedure date per Dr. Lina Sar.       OTHER INSTRUCTIONS  You will need a responsible adult at least 75 years of age to accompany you and drive you home.   This person must remain in the waiting room during your procedure.  Wear loose fitting clothing that is easily removed.  Leave jewelry and other valuables at home.  However, you may wish to bring a book to read or an iPod/MP3 player to listen to music as you wait for your procedure to start.  Remove all body piercing jewelry and leave at home.  Total time from sign-in until discharge is approximately 2-3 hours.  You should go home directly after your procedure and rest.  You can resume normal activities the day  after your procedure.  The day of your procedure you should not:   Drive   Make legal decisions   Operate machinery   Drink alcohol   Return to work  You will receive specific instructions about eating, activities and medications before you leave.   The above instructions have been reviewed and explained to me by  Lamona Curl CMA Duncan Dull)  Feb 27, 2010 2:04 PM     I fully understand and can verbalize these instructions _____________________________ Date 02/27/10

## 2010-10-30 NOTE — Discharge Summary (Signed)
Summary: Recurrent Common Bile Duct Stone  [name], [name]                  ACCOUNT NO.:  [id number]      MEDICAL RECORD NO.:  [id number]          PATIENT TYPE:  AMB      LOCATION:  ENDO                         FACILITY:  [facility]      PHYSICIAN:  Lonia Blood, M.D.       DATE OF BIRTH:  1923-06-07      DATE OF ADMISSION:  07/12/2009   DATE OF DISCHARGE:  07/12/2009                                  DISCHARGE SUMMARY      PRIMARY CARE PHYSICIAN:  Lucky Cowboy, MD      DISCHARGE DIAGNOSES:   1. Recurrent common bile duct stone, status post endoscopic retrograde       cholangiopancreatography.   2. History of coronary artery disease, status post stenting.   3. History of massive pulmonary emboli, on chronic Coumadin.   4. Osteoporosis.   5. Chronic low back pain.   6. Hypertension.   7. Hyperlipidemia.      DISCHARGE MEDICATIONS:   1. Coumadin 5 mg by mouth daily.   2. Lovenox 150 mg subcutaneously daily.   3. Pravastatin 20 mg daily.   4. Tranxene 7.5 mg daily.   5. Diltiazem 120 mg daily.   6. Aspirin 81 mg daily.   7. Calcium 1 tablet at bedtime.      CONDITION ON DISCHARGE:  [name] was discharged in good condition.   She was alert, oriented without any signs of nausea, vomiting, or   abdominal pain.  She was also afebrile.  The patient was scheduled to   follow up with Dr. Julieanne Manson on Monday, July 17, 2009, for a   Coumadin check, and she was also told to call the office of Dr. Lucky Cowboy, her primary care physician to arrange for hospital followup.      CONSULTATION DURING THIS ADMISSION:  The patient was seen in   consultation by [facility].      PROCEDURES DURING THIS ADMISSION:  On July 12, 2009, the patient   underwent an ERCP with sphincterotomy and extraction of the common bile   duct stones.      HISTORY AND PHYSICAL:  Refer to the dictated H and P, which was done in   October 2010 by Dr. Virginia Rochester.      HOSPITAL  COURSE:  [name], an 76 year old woman with history of common   bile duct stones presented to the emergency room, complains of abdominal   pain, and elevated liver function enzymes.  She was fully evaluated and   felt that her pain was due to recurrence of her common bile duct stones.   The patient underwent eventually an ultrasound of the abdomen on July 11, 2009, which indicated questionable dilated common bile duct.  Ms.   Elwell was taken to the ERCP suite by Dr. Arlyce Dice, and he performed   sphincterotomy and extraction of the common bile duct stones.   Postprocedure, the patient felt well with resolution of her abdominal   pain and  decreased AST, ALT,   and normalizing of the bilirubin and alkaline phosphatase.  The patient   was instructed on a low-fat diet.  The patient was told to resume   anticoagulation with Lovenox bridge until Monday, July 17, 2009,   where she goes back to see Dr. Julieanne Manson.               Lonia Blood, M.D.   Electronically Signed            SL/MEDQ  D:  07/13/2009  T:  07/15/2009  Job:  [id number]      cc:   Lucky Cowboy, M.D.   Thereasa Solo. Little, M.D.

## 2010-10-30 NOTE — Assessment & Plan Note (Signed)
Summary: ANEMIA             DEBORAH   History of Present Illness Visit Type: Follow-up Visit Primary GI MD: Lina Sar MD Primary Provider: Lucky Cowboy, MD Chief Complaint: anemia denies any rectal bleeding History of Present Illness:   This is an 77 year old white female with iron deficiency anemia. She has a hemoglobin of 11.9, hematocrit of 37.2, MCV of 72 and ferritin of 10 with 10% iron saturation. She is on Coumadin for a prior pulmonary embolus. She denies any visible blood per rectum. Patient has never had a colonoscopy. We have seen her in the past for acute cholecystitis, biliary pancreatitis and a retained common bile duct stone. She is status post ERCP in April 2009 and again in October 2010. She denies any specific GI symptoms other than constipation.   GI Review of Systems      Denies abdominal pain, acid reflux, belching, bloating, chest pain, dysphagia with liquids, dysphagia with solids, heartburn, loss of appetite, nausea, vomiting, vomiting blood, weight loss, and  weight gain.      Reports constipation.     Denies anal fissure, black tarry stools, change in bowel habit, diarrhea, diverticulosis, fecal incontinence, heme positive stool, hemorrhoids, irritable bowel syndrome, jaundice, light color stool, liver problems, rectal bleeding, and  rectal pain.    Current Medications (verified): 1)  Coumadin 2.5 Mg  Tabs (Warfarin Sodium) .... As Directed Y [facility] 2)  Vitamin D 1000 Unit  Tabs (Cholecalciferol) .... Take 1 Tablet By Mouth Once A Day 3)  Aspirin 81 Mg  Tbec (Aspirin) .... Take 1 Tablet By Mouth Once A Day 4)  Calcitrate/vitamin D 315-200 Mg-Unit  Tabs (Calcium Citrate-Vitamin D) .... One By Mouth Once Daily 5)  Flurazepam Hcl 30 Mg  Caps (Flurazepam Hcl) .... One By Mouth At Bedtime 6)  Cardizem Cd 120 Mg  Cp24 (Diltiazem Hcl Coated Beads) .... One By Mouth Once Daily 7)  Vitamin D3 2000 Unit  Caps (Cholecalciferol) .... Take Two By Mouth Once  Daily 8)  Pravastatin Sodium 20 Mg Tabs (Pravastatin Sodium) .Marland Kitchen.. 1 Tablet By Mouth Once Daily 9)  Clorazepate Dipotassium 7.5 Mg Tabs (Clorazepate Dipotassium) .Marland Kitchen.. 1 Tablet By Mouth Once Daily  Allergies (verified): No Known Drug Allergies  Past History:  Past Medical History: Reviewed history from 03/03/2008 and no changes required. Current Problems:  B12 DEFICIENCY (ICD-266.2) MYOCARDIAL INFARCTION, HX OF (ICD-412) DEGENERATIVE DISC DISEASE (ICD-722.6) SUPRAVENTRICULAR TACHYCARDIA, PAROXYSMAL, HX OF (ICD-V12.59) ANEMIA, IRON DEFICIENCY (ICD-280.9) HYPERLIPIDEMIA (ICD-272.4) OSTEOPOROSIS (ICD-733.00) PULMONARY EMBOLISM, HX OF (ICD-V12.51) CORONARY ARTERY DISEASE (ICD-414.00) CHOLEDOCHOLITHIASIS, HX OF (ICD-V12.79) Hx of CHOLANGITIS (ICD-576.1) Hx of PANCREATITIS, HX OF (ICD-V12.70)  Past Surgical History: Reviewed history from 03/02/2008 and no changes required. kypphoplasty vertebroplasty laminectomy sphencterotomy with stone extraciton-pancreas  Review of Systems       The patient complains of back pain, fatigue, muscle pains/cramps, and swelling of feet/legs.         Pertinent positive and negative review of systems were noted in the above HPI. All other ROS was otherwise negative.   Vital Signs:  Patient profile:   77 year old female Height:      61 inches Weight:      210 pounds Pulse rate:   68 / minute Pulse rhythm:   regular BP sitting:   128 / 78  (left arm)  Vitals Entered By: Milford Cage NCMA (December 26, 2009 4:10 PM)  Physical Exam  General:  elderly, overweight, alert and oriented. Hard  of hearing. Patient travels in the wheelchair. Mouth:  No deformity or lesions, dentition normal. Neck:  Supple; no masses or thyromegaly. Lungs:  Clear throughout to auscultation. Heart:  Regular rate and rhythm; no murmurs, rubs,  or bruits. Abdomen:  large obese abdomen, soft, nontender with normoactive bowel sounds. Rectal:  soft Hemoccult-positive  stool. Extremities:  No clubbing, cyanosis, edema or deformities noted.   Impression & Recommendations:  Problem # 1:  ANEMIA, IRON DEFICIENCY (ICD-280.9) Patient has severe iron deficiency anemia with hemoccult-Positive stool.  We need to rule out occult GI blood loss. Patient has never had a colonoscopy or upper endoscopy ( except for the ERCP). I have discussed these test with her but she is not interested in endoscopic studies at this time; mostly colonoscopy because of her age and difficulty in prepping her. She would prefer to stay on iron supplements for the next 2 months and recheck her blood count after that.  Problem # 2:  B12 DEFICIENCY (ICD-266.2) Patient takes 1,000 mcg IM monthly which is given by her husband.  Problem # 3:  CHOLEDOCHOLITHIASIS, HX OF (ICD-V12.79) Patient is status post ERCP and sphincterotomy with the bile duct stone extraction x 2. She is currently asymptomatic.  Patient Instructions: 1)  Please schedule an appointment for an 8 week follow up. We will recheck stool hemoccults as well as iron studies and then discuss again having a colonoscopy. 2)  Please pick up your ferrous sulfate (iron) prescription at the pharmacy. 3)  Copy sent to : Lucky Cowboy, MD 4)  The medication list was reviewed and reconciled.  All changed / newly prescribed medications were explained.  A complete medication list was provided to the patient / caregiver. Prescriptions: FERROUS SULFATE 325 (65 FE) MG TABS (FERROUS SULFATE) Take 1 tablet by mouth three times a day  #90 x 2   Entered by:   Hortense Ramal CMA (AAMA)   Authorized by:   Hart Carwin MD   Signed by:   Hortense Ramal CMA (AAMA) on 12/26/2009   Method used:   Electronically to        [facility]  [phone]* (retail)       [address]       [place], [place]  [address]       Ph: [phone] or [phone]       Fax: [phone]   RxID:   [id number]

## 2010-10-30 NOTE — Consult Note (Signed)
Summary: No surgical intervention needed  NAME:  [name] [name], [name] [name]                  ACCOUNT NO.:  [id number]   MEDICAL RECORD NO.:  [id number]          PATIENT TYPE:  INP   LOCATION:  3308                         FACILITY:  [facility]   PHYSICIAN:  Maisie Fus A. Cornett, M.D.DATE OF BIRTH:  [birth year]   DATE OF CONSULTATION:  DATE OF DISCHARGE:                                   CONSULTATION   ADDENDUM:  In addition to the plan of care, after discussion with Dr.  Luisa Hart, he feels at this time that acute surgical intervention is not  indicated due to the biliary pancreatitis.  We do need to allow this  situation to cool down or resolve.  We have asked interventional radiology  to come and evaluate the patient for possible insertion of percutaneous  drain or aspiration of the fluid around the gallbladder.  This will help  minimize the patient's pain, relieve or decompress the fluid and hopefully  help with the acute infectious process in addition to measures already in  place including antibiotics.      Allison L. Rennis Harding, N.P.      Thomas A. Cornett, M.D.  Electronically Signed    ALE/MEDQ  D:  12/13/2005  T:  12/15/2005  Job:  [id number]

## 2010-10-30 NOTE — Consult Note (Signed)
Summary: Acute Cholecystitis and Biliary Pancreatitis   NAME:  [name] [name], [name] [name]                  ACCOUNT NO.:  [id number]   MEDICAL RECORD NO.:  [id number]          PATIENT TYPE:  INP   LOCATION:  3308                         FACILITY:  [facility]   PHYSICIAN:  Revonda Standard L. Rennis Harding, N.P. DATE OF BIRTH:  [birth year]   DATE OF CONSULTATION:  12/13/2005  DATE OF DISCHARGE:                                   CONSULTATION   REQUESTING PHYSICIAN:  Elliot Cousin, M.D.   PRIMARY CARE PHYSICIAN:  Lucky Cowboy, M.D.   CARDIOLOGIST:  Thereasa Solo. Little, M.D.   REASON FOR CONSULTATION:  Acute cholecystitis with biliary pancreatitis.   HISTORY OF THE PRESENT ILLNESS:  [name] [name] is an 77 year old female patient  with a known history of CAD with stents and PCI.  Her last catheterization  was in January 2007 and at that time the stents were revealed to be patent.  She also has a new diagnosis of bilateral PE secondary to isolated DVT.  She  is now on Coumadin and her INR is currently therapeutic.  She was admitted  on December 12, 2005 after developing the acute onset of severe abdominal pain  with associated nausea and vomiting that morning.  Her symptoms have been  unrelenting despite the use of pain medications and NPO status.  Her pain is  located in the right shoulder, right upper quadrant and left upper quadrant.  She came to the ER for evaluation yesterday. At that time her lipase was  greater than 2,000.  An ultrasound was unrevealing, but a CT of the abdomen  and pelvis showed biliary duct dilatation, mild pancreatitis, and an  extensive inflammatory process involving the right upper quadrant with  possible abscess and gangrenous gallbladder.  Her white count on  presentation was 8,200 and this morning is up to 24,400.  Her initial LFTs  were elevated with a total bilirubin of 2.1.  The patient is currently  afebrile.  Her blood pressure has varied systolically from the 120s and now  is down to the  one-teens.  She is complaining of severe pain in the right  shoulder, right upper quadrant and left upper quadrant  with the sensation  of nausea and reflux.   The husband tells me that about two weeks ago the patient presented to the  ER with similar symptoms, but much less severe and duration of only about  six to seven hours.  These symptoms were apparently self-limiting.  Because  of her cardiac history she was worked up as a possible cardiac cause of this  pain; and, apparently that workup was negative, and the patient was  discharged to home from the ER.   REVIEW OF SYSTEMS:  The review of systems is per the history of present  illness.  There are no recently problem related to chest pain, shortness of  breath, or tachy palpitations. GASTROINTESTINAL:  The GI symptoms are as  above.  GENITOURINARY:  No dysuria.  No dark or bloody stools.  MUSCULOSKELETAL:  No lower extremity swelling or leg pain.   FAMILY HISTORY:  The family medical history is noncontributory.   SOCIAL HISTORY:  The patient is married.  Her husband is at the bedside.  She does not utilize tobacco or alcohol products.   PAST MEDICAL HISTORY:  1.  CAD with prior stent to the LAD, patent per catheterization in January      2007.  2.  Normal LVEF of 50-55% per cath in January 2007.  3.  Hypertension.  4.  Left __________  deep vein thrombosis in January 2007.  5.  Bilateral pulmonary embolus currently on Coumadin anticoagulation      diagnosed in January 2007.  6.  Dyslipidemia.  7.  COPD.  8.  Obesity.  9.  Paroxysmal supraventricular tachycardia.   PAST SURGICAL HISTORY:  Hysterectomy.   ALLERGIES:  No known drug allergies.   CURRENT MEDICATIONS:  1.  Zosyn IV and Flagyl IV started today by Dr. Sherrie Mustache.  2.  Coumadin is on hold.  3.  The patient is to receive one dose of vitamin K subcu now.  4.  The patient's Lopressor has been changed to IV.  5.  The patient is on p.r.n. Zofran.  6.  The patient is  on Protonix.  7.  The patient has been on p.r.n. Dilaudid.   PHYSICAL EXAMINATION:  GENERAL APPEARANCE:  In general this is an elderly  female who is sedated and resting.  When she was aroused for the exam she  became quite anxious secondary to pain; complaining of severe pain.  VITAL SIGNS:  Temp 98.1, BP 110/45, pulse 72 and regular, and respirations  20.  NEUROLOGIC EXAMINATION:  The patient is alert and oriented times three.  She  is somewhat groggy from the sedation and the pain medications.  She moves  all extremities times four.  No obvious focal neurological deficits.  HEENT:  The head is normocephalic.  The sclerae is not injected,  NECK:  The neck is supple without adenopathy.  CHEST:  Bilateral lung sounds are clear to auscultation.  The patient is  sating 95% on 4 liters nasal cannula O2.  HEART:  Cardiac reveals S1 and S2.  No obvious rubs, murmurs, thrills or  gallops.  No JVD.  ABDOMEN:  The abdomen is soft.  Bowel sounds are not present.  She is  exquisitely tender in the right upper quadrant and less in the left upper  quadrant.  PELVIC:  Genitourinary - the patient has been incontinent of urine and the  nursing staff is inserting a Foley catheter at the present time.  EXTREMITIES:  The extremities show __________  edema bilaterally in the  lower extremities.  Pulse are palpable.   LABORATORY DATA:  Sodium 141, potassium 4.6, CO2 25, BUN 25, creatinine 1.4.  AST 416 down from 641, ALT 407 up from 344.  Total bilirubin 1.6 down from  2.1 and alkaline phosphatase 81.  White count 24,400 today, hemoglobin 11.2,  platelets 250,000.  Lipase 1,129 down from greater than 2,000.  PT is 22.8  today and INR is 2.0.   Diagnostic studies:  CT of the abdomen and pelvis have been done and is  described in the history of present illness.  EKG shows sinus rhythm without  acute ischemic changes.  Portable chest x-ray was done on admission, which showed a persistently elevated right  hemidiaphragm, otherwise no chronic  issues and no acute issues.   IMPRESSION:  1.  Acute severe inflammatory cholecystitis.  2.  Transaminitis.  3.  Biliary pancreatitis.  4.  History of recent bilateral pulmonary embolism on Coumadin, fully      anticoagulated.  5.  Severe abdominal pain secondary to acute cholecystitis and pancreatitis.  6.  Leukocytosis secondary to acute cholecystitis, early systemic      inflammatory response syndrome.  7.  Known coronary artery disease with left anterior descending stent,      patent per catheterization in January 2007.  8.  Hypertension with preserved left ventricular function.   PLAN:  1.  Agree with reversing anticoagulation.  The patient will need IV heparin      once INR is less than 2.0 because of recent bilateral PE.  2.  Encouraging with bowel rest and IV fluids that her lipase and LFTs are      already decreasing.  More concerning though is the continued severe      pain, elevated white count and fluid collection versus abscess on CT.  3.  Agree with starting Zosyn and Flagyl, and repeat labs in the morning.  4.  Normally with pancreatitis we allow for a cool down phase.  We will      discuss this with Dr. Luisa Hart, but with the degree of pain and      leukocytosis this may be an indication to go ahead and proceed with      acute cholecystitis.  At this point it would be difficult because the      INR right now is therapeutic.  We will continue to monitor this      situation.  5.  If we day of discharge not proceed immediately with patient may need GI      evaluation for possible ERCP before proceeding.  6.  Agree repeating amylase, lipase and CMET in the morning.  7.  Cardiac clearance is pending per Dr. Clarene Duke; still as noted awaiting INR      to decrease to around 1.5-1.6 before can safely proceed with      cholecystectomy.  Agree with vitamin K and holding Coumadin.  8.  Continue bowel rest and IV fluids.  The patient had normal  LV function      and blood pressure is beginning to trend down.  Would increase fluids to      150/hour.  Continue potassium supplementation.  9.  We will also attempt to maximize pain control without oversedating the      patient.  We will change to low-dose PCA Dilaudid.  10. The patient had not slept since the night before and is exhausted, but      has had difficulty sleeping due to pain.  The patient's creatinine is      1.4, so cannot give regularly scheduled Toradol.  We will give one dose      IV Toradol today to see if this will help to get the patient's pain      under better control for the time being.  11. Agree with checking blood culture remaining in the critical care      setting; concern for bacteremia secondary to ascending cholangitis.  The      patient certainly meets criteria at this point for early systemic      inflammatory response syndrome; continue to monitor closely.      Allison L. Rennis Harding, N.P.    ALE/MEDQ  D:  12/13/2005  T:  12/15/2005  Job:  [id number]   cc:   Thereasa Solo. Little, M.D.  Fax: [phone]   Lucky Cowboy, M.D.  Fax: [phone]

## 2010-12-19 LAB — CBC
HCT: 35.2 % — ABNORMAL LOW (ref 36.0–46.0)
Hemoglobin: 11.5 g/dL — ABNORMAL LOW (ref 12.0–15.0)
MCHC: 32.5 g/dL (ref 30.0–36.0)
MCV: 73.5 fL — ABNORMAL LOW (ref 78.0–100.0)
RDW: 20 % — ABNORMAL HIGH (ref 11.5–15.5)

## 2010-12-19 LAB — URINALYSIS, ROUTINE W REFLEX MICROSCOPIC
Glucose, UA: NEGATIVE mg/dL
Hgb urine dipstick: NEGATIVE
Specific Gravity, Urine: 1.023 (ref 1.005–1.030)
pH: 6 (ref 5.0–8.0)

## 2010-12-19 LAB — BASIC METABOLIC PANEL
BUN: 15 mg/dL (ref 6–23)
Chloride: 102 mEq/L (ref 96–112)
Creatinine, Ser: 0.76 mg/dL (ref 0.4–1.2)
Glucose, Bld: 122 mg/dL — ABNORMAL HIGH (ref 70–99)
Potassium: 3.3 mEq/L — ABNORMAL LOW (ref 3.5–5.1)

## 2010-12-19 LAB — DIFFERENTIAL
Basophils Absolute: 0.1 10*3/uL (ref 0.0–0.1)
Basophils Relative: 1 % (ref 0–1)
Eosinophils Absolute: 0.1 10*3/uL (ref 0.0–0.7)
Eosinophils Relative: 2 % (ref 0–5)
Monocytes Absolute: 0.8 10*3/uL (ref 0.1–1.0)
Monocytes Relative: 10 % (ref 3–12)
Neutro Abs: 5.1 10*3/uL (ref 1.7–7.7)
Neutrophils Relative %: 63 % (ref 43–77)

## 2010-12-19 LAB — POCT CARDIAC MARKERS
CKMB, poc: <1 ng/mL — ABNORMAL LOW (ref 1.0–8.0)
Myoglobin, poc: 51.8 ng/mL (ref 12–200)
Troponin i, poc: <0.05 ng/mL (ref 0.00–0.09)

## 2010-12-31 ENCOUNTER — Other Ambulatory Visit (HOSPITAL_COMMUNITY): Payer: Self-pay | Admitting: Internal Medicine

## 2010-12-31 DIAGNOSIS — Z1231 Encounter for screening mammogram for malignant neoplasm of breast: Secondary | ICD-10-CM

## 2011-01-02 ENCOUNTER — Ambulatory Visit (HOSPITAL_COMMUNITY)
Admission: RE | Admit: 2011-01-02 | Discharge: 2011-01-02 | Disposition: A | Discharge status: Home / Self Care | Payer: Medicare Other | Source: Ambulatory Visit | Attending: Internal Medicine | Admitting: Internal Medicine

## 2011-01-02 ENCOUNTER — Other Ambulatory Visit (HOSPITAL_COMMUNITY): Payer: Self-pay | Admitting: Internal Medicine

## 2011-01-02 DIAGNOSIS — R059 Cough, unspecified: Secondary | ICD-10-CM

## 2011-01-02 DIAGNOSIS — I10 Essential (primary) hypertension: Secondary | ICD-10-CM

## 2011-01-02 DIAGNOSIS — Z0000 Encounter for general adult medical examination without abnormal findings: Secondary | ICD-10-CM | POA: Insufficient documentation

## 2011-01-02 DIAGNOSIS — Z1231 Encounter for screening mammogram for malignant neoplasm of breast: Secondary | ICD-10-CM | POA: Insufficient documentation

## 2011-01-02 DIAGNOSIS — R05 Cough: Secondary | ICD-10-CM

## 2011-01-03 LAB — COMPREHENSIVE METABOLIC PANEL
ALT: 954 U/L — ABNORMAL HIGH (ref 0–35)
AST: 1004 U/L — ABNORMAL HIGH (ref 0–37)
AST: 759 U/L — ABNORMAL HIGH (ref 0–37)
Albumin: 3.6 g/dL (ref 3.5–5.2)
Alkaline Phosphatase: 121 U/L — ABNORMAL HIGH (ref 39–117)
Alkaline Phosphatase: 127 U/L — ABNORMAL HIGH (ref 39–117)
BUN: 16 mg/dL (ref 6–23)
CO2: 28 mEq/L (ref 19–32)
Calcium: 8.6 mg/dL (ref 8.4–10.5)
Chloride: 101 mEq/L (ref 96–112)
Chloride: 104 mEq/L (ref 96–112)
Creatinine, Ser: 0.78 mg/dL (ref 0.4–1.2)
GFR calc Af Amer: >60 mL/min (ref >60)
GFR calc Af Amer: >60 mL/min (ref >60)
GFR calc non Af Amer: >60 mL/min (ref >60)
Potassium: 3.8 mEq/L (ref 3.5–5.1)
Potassium: 4 mEq/L (ref 3.5–5.1)
Sodium: 136 mEq/L (ref 135–145)
Total Bilirubin: 1.3 mg/dL — ABNORMAL HIGH (ref 0.3–1.2)
Total Protein: 6.6 g/dL (ref 6.0–8.3)

## 2011-01-03 LAB — POCT I-STAT, CHEM 8
Chloride: 103 mEq/L (ref 96–112)
HCT: 48 % — ABNORMAL HIGH (ref 36.0–46.0)
Hemoglobin: 16.3 g/dL — ABNORMAL HIGH (ref 12.0–15.0)
Potassium: 4.3 mEq/L (ref 3.5–5.1)
Sodium: 138 mEq/L (ref 135–145)

## 2011-01-03 LAB — URINALYSIS, ROUTINE W REFLEX MICROSCOPIC
Glucose, UA: NEGATIVE mg/dL
Hgb urine dipstick: NEGATIVE
Specific Gravity, Urine: 1.023 (ref 1.005–1.030)
Urobilinogen, UA: 4 mg/dL — ABNORMAL HIGH (ref 0.0–1.0)
pH: 5.5 (ref 5.0–8.0)

## 2011-01-03 LAB — PROTIME-INR
INR: 1.17 (ref 0.00–1.49)
INR: 1.47 (ref 0.00–1.49)
INR: 1.96 — ABNORMAL HIGH (ref 0.00–1.49)
Prothrombin Time: 14.8 seconds (ref 11.6–15.2)
Prothrombin Time: 22.2 seconds — ABNORMAL HIGH (ref 11.6–15.2)

## 2011-01-03 LAB — HEPATIC FUNCTION PANEL
ALT: 571 U/L — ABNORMAL HIGH (ref 0–35)
AST: 242 U/L — ABNORMAL HIGH (ref 0–37)
Bilirubin, Direct: 0.3 mg/dL (ref 0.0–0.3)
Bilirubin, Direct: 0.8 mg/dL — ABNORMAL HIGH (ref 0.0–0.3)
Indirect Bilirubin: 1.3 mg/dL — ABNORMAL HIGH (ref 0.3–0.9)
Total Bilirubin: 1 mg/dL (ref 0.3–1.2)
Total Bilirubin: 2.1 mg/dL — ABNORMAL HIGH (ref 0.3–1.2)

## 2011-01-03 LAB — CBC
HCT: 40.5 % (ref 36.0–46.0)
Hemoglobin: 13.6 g/dL (ref 12.0–15.0)
MCV: 89.6 fL (ref 78.0–100.0)
Platelets: 191 10*3/uL (ref 150–400)
RDW: 14.7 % (ref 11.5–15.5)
RDW: 15 % (ref 11.5–15.5)
WBC: 10.7 10*3/uL — ABNORMAL HIGH (ref 4.0–10.5)

## 2011-01-03 LAB — DIFFERENTIAL
Basophils Absolute: 0 10*3/uL (ref 0.0–0.1)
Eosinophils Relative: 0 % (ref 0–5)
Lymphocytes Relative: 7 % — ABNORMAL LOW (ref 12–46)
Monocytes Absolute: 0 10*3/uL — ABNORMAL LOW (ref 0.1–1.0)
Monocytes Relative: 0 % — ABNORMAL LOW (ref 3–12)
Neutro Abs: 9.9 10*3/uL — ABNORMAL HIGH (ref 1.7–7.7)

## 2011-01-03 LAB — CK TOTAL AND CKMB (NOT AT ARMC)
CK, MB: 1.2 ng/mL (ref 0.3–4.0)
Relative Index: INVALID (ref 0.0–2.5)
Total CK: 59 U/L (ref 7–177)

## 2011-01-03 LAB — TROPONIN I: Troponin I: 0.04 ng/mL (ref 0.00–0.06)

## 2011-01-12 ENCOUNTER — Other Ambulatory Visit: Payer: Self-pay | Admitting: Internal Medicine

## 2011-02-12 NOTE — Discharge Summary (Signed)
[name] [name]                  ACCOUNT NO.:  [id number]   MEDICAL RECORD NO.:  [id number]          PATIENT TYPE:  INP   LOCATION:  5121                         FACILITY:  [facility]   PHYSICIAN:  Lonia Blood, M.D.       DATE OF BIRTH:  09/04/1923   DATE OF ADMISSION:  01/08/2008  DATE OF DISCHARGE:  01/13/2008                               DISCHARGE SUMMARY   PRIMARY CARE PHYSICIAN:  Lucky Cowboy, MD   DISCHARGE DIAGNOSES:  1. Retained common bile duct stone with cholangitis - status post ERCP      and stone extraction.  2. Coronary artery disease status post stenting.  3. History of a massive pulmonary embolus in January 2007, on chronic      Coumadin.  4. Osteoporosis.  5. History of laminectomy.  6. History of vertebroplasty for compression fractures.  7. Hyperlipidemia.   DISCHARGE MEDICATIONS:  1. Coumadin 5 mg at 6 p.m. daily, until seen by the Coumadin Clinic at      Dr. Fredirick Maudlin office.  2. Diltiazem 240 mg daily.  3. Zocor 80 mg at bedtime.  4. Vitamin D 1000 mg daily.  5. Calcium daily.  6. Aspirin 81 mg daily.  7. Forteo injections daily.  8. Darvocet as needed for pain.  9. Tranxene as needed for anxiety.   CONDITION ON DISCHARGE:  [name] [name] is discharged in good condition.  She  is instructed to follow up as follows:  1. On January 15, 2008, Dr. Gaspar Garbe Little's office for PT/INR check.  2. Dr. Lucky Cowboy, the patient's primary care physician on January 20, 2008 at 2:45 p.m. for a clinical exam and followup of the liver      function tests.  3. Dr. Lina Sar with [facility].  The patient to call      [phone] in May to schedule an appointment.  The patient will      follow up for anemia and also for possibility of further workup of      the elevated CA 19-9.   PROCEDURES:  Procedures during this admission:  1. January 08, 2008, the patient underwent a CT scan of abdomen and      pelvis with findings of dilated intrahepatic ducts and  common bile      duct as well as questionable low attenuation within the neck and      body of the pancreas.  No calcified gallstones.  2. January 12, 2008, ERCP with sphincterotomy by Dr. Lina Sar.   CONSULTATION DURING THIS ADMISSION:  Dr. Juanda Chance from Gastroenterology.   HISTORY AND PHYSICAL:  Refer to the dictated H&P, which was done by Dr.  [name] Beat on January 08, 2008.  Briefly, [name] [name] presented to the  emergency room with complaints of sharp epigastric pain with radiation  to the back as well as nausea and vomiting.  She was found to have  elevated AST and ALT and a dilated common bile duct on abdominal  ultrasound.  She was referred for further workup and observation.   HOSPITAL  COURSE:  1. Cholangitis.  [name] [name] was admitted to Acute Care Unit of [facility].  She was placed on intravenous fluids with      symptomatic treatment.  Her Coumadin was held.  The patient was      started on empiric antibiotics, and she had a CT scan of her      abdomen and pelvis, which did not indicate presence of a massive      pancreatic mass.  [name] [name] was further observed and treated      finally with an ERCP and sphincterotomy on January 12, 2008.  Post-      procedure, the patient did well without any signs of acute      pancreatitis.  The patient will follow up with her primary care      physician to monitor the trend of the LFTs, but by the time of the      discharge, the only abnormal LFT is the ALT of 102.  2. History of pulmonary emboli.  [name] [name] has been continued on the      Coumadin beyond her 1 year.  For this reason, we will place the      patient back on Coumadin at the time of the discharge.  She is      discharged with an INR of 1.2 and instructed to take Coumadin at 5      mg daily and to follow up with Dr. Clarene Duke for further adjustments.  3. Iron deficiency anemia.  [name] [name] was started on a proton pump      inhibitor as well as iron tablets.  She will  follow up with Dr.      Lina Sar for constellation of EGD colonoscopy in the outpatient      setting.  4. Elevated CA 19-9.  I have discussed this with Dr. Lina Sar, and      she felt that this was related to the acute illness.  She will      follow up in the office in regards to further workup, if the CA 19-      9 remains elevated.  Currently, we do not have clinical and      radiological data to strongly suspect the pancreatic cancer.      Lonia Blood, M.D.  Electronically Signed     SL/MEDQ  D:  01/13/2008  T:  01/14/2008  Job:  [id number]   cc:   Lucky Cowboy, M.D.  Thereasa Solo. Little, M.D.  Hedwig Morton. Juanda Chance, MD

## 2011-02-12 NOTE — H&P (Signed)
NAMEVANA, ARIF                  ACCOUNT NO.:  [id number]   MEDICAL RECORD NO.:  [id number]          PATIENT TYPE:  INP   LOCATION:  5121                         FACILITY:  [facility]   PHYSICIAN:  Hettie Holstein, D.O.    DATE OF BIRTH:  26-Nov-1922   DATE OF ADMISSION:  01/08/2008  DATE OF DISCHARGE:                              HISTORY & PHYSICAL   PRIMARY CARE PHYSICIAN:  Jeoffrey Massed, M.D.   PRIMARY CARDIOLOGIST:  Thereasa Solo. Little, M.D.   CHIEF COMPLAINT:  Midepigastric pain radiating to her back with nausea  and vomiting.   HISTORY OF PRESENT ILLNESS:  Ms. Pedigo is a very pleasant 77 year old  female who resides at home with her husband who is in her usual state of  health up until yesterday afternoon when she developed some pain in her  mid epigastrium after having dinner.  This became significant.  She  tried Maalox without relief.  This began to radiate into the midportion  of her back and she began to develop some nausea.  She called her  cardiologist who referred her to the emergency department where she  developed some nausea and vomiting upon arrival.  In the event, she  underwent initial evaluation that revealed elevated transaminases with  AST of 1108 and ALT of 804.  Her total bilirubin was elevated 2.0, and  she was asked to be admitted by the medical service for further  diagnostic workup and management.   PAST MEDICAL HISTORY:  1. This is significant for cholecystitis, status post cholecystostomy      tube back in July 2007, and subsequent cholecystectomy.  Operative      reports indicate that an intraoperative cholangiogram was performed      without revealing a retained stone, history of pulmonary embolus in      January 2007.  2. She has had a cardiac catheterization as recently as January 2007,      where she had finding of a widely patent LAD stent.  It is also      reported that circumflex was free of disease and LAD was stented.      The RCA was a  dominant vessel, also no significant diseases are      reported as well as the PDA.  Her LV gram revealed a normal LV      systolic function.  3. PSVT.  4. History of pancreatitis related to gallstones.  5. History of laminectomy.  6. History of kyphoplasty and vertebroplasty.  7. Dyslipidemia.   MEDICATIONS:  I contacted [facility] on [address] to obtain these as  the family does not know what medications she takes at home.  Coumadin.  She takes [employer].  She takes aspirin,  diltiazem ER 120 mg daily, Zocor  80 mg daily,  Dalmane 30 mg daily, and Vytorin 10/20 mg daily.   ALLERGIES:  No known drug allergies.   SOCIAL HISTORY:  She does not drink or smoke.  She resides with her  husband, he is reachable at [phone].   FAMILY HISTORY:  Noncontributory.   REVIEW OF  SYSTEMS:  She had been in her usual state of health.  She  ambulates about with a walker.  She denies any shortness of breath,  swelling, and diarrhea.  Only nausea or vomiting with presenting illness  as described above.  No known fevers in the event.  Further review is  unremarkable.   PHYSICAL EXAMINATION:  Blood pressure is 107/52, temperature is 99.9,  heart rate 64, respirations 20, and O2 saturation 100%.  HEENT:  Head is normocephalic and atraumatic.  Extraocular muscles are  intact.  NECK:  Supple, nontender, no palpable thyromegaly, or mass.  CARDIOVASCULAR EXAM:  Normal S1 and S2.  LUNGS:  Clear to auscultation bilaterally.  ABDOMEN:  Soft with some midepigastric discomfort to deep palpation.  There is no rebound or guarding and no rigidity.  Bowel sounds are  normoactive.  LOWER EXTREMITIES:  No calf tenderness.  No edema.  NEUROLOGIC EXAM:  Without focal neurologic deficit.  She is alert and  oriented.   LABORATORY DATA:  Her sodium is 137, potassium 4.1, BUN is 18,  creatinine 1.01, glucose 150, total bilirubin is 2.0, alk phos is 115,  AST/ALT 1108/804, and albumin 3.1.  WBC of 22,000, hemoglobin  10.5, and  platelet count 244.  MCV of 74 and lipase 33.  An ultrasound revealed  common bile duct with of 14 mm of dilation suggestive of potential  biliary obstruction.  Chest x-ray reveals only minimal left basilar  atelectasis.   ASSESSMENT.:  1. Abdominal pain, nausea, or vomiting with elevated transaminases,      suspect retained common bile duct stone.  2. Hyperbilirubinemia.  3. Coronary artery disease, stable.  4. History of pulmonary embolism with chronic Coumadin therapy.  5. Leukocytosis.  6. Hypoalbuminemia.  7. History of paroxysmal supraventricular tachycardia.  8. Obesity.   PLAN:  At this time, Ms. Mette will be admitted to medical-surgical  floor.  I have contacted Dr. Lina Sar on-call for gastroenterology,  who has recommended to have check a CT scan for potential mass to  explain the obstruction.  We will follow her transaminases and  bilirubin, and administer Unasyn for now, and follow clinical course and  manage her symptomatically.      Hettie Holstein, D.O.  Electronically Signed     ESS/MEDQ  D:  01/08/2008  T:  01/09/2008  Job:  [id number]   cc:   Jeoffrey Massed, MD

## 2011-02-12 NOTE — Op Note (Signed)
NAMEMARIJAYNE, [name]                  ACCOUNT NO.:  [id number]   MEDICAL RECORD NO.:  [id number]          PATIENT TYPE:  INP   LOCATION:  5121                         FACILITY:  [facility]   PHYSICIAN:  Hedwig Morton. Juanda Chance, MD     DATE OF BIRTH:  Nov 06, 1922   DATE OF PROCEDURE:  01/12/2008  DATE OF DISCHARGE:                               OPERATIVE REPORT   NAME OF PROCEDURE:  ERCP, sphincterotomy and stone extraction.   INDICATIONS:  This 77 year old white female presented with acute upper  abdominal pain radiating to her back, somewhat elevated total bilirubin  and transaminases.  She had a history of biliary pancreatitis in 2007  and underwent cholecystostomy and subsequently laparoscopic  cholecystectomy in July 2007.  A CT scan as well as abdominal ultrasound  confirmed enlarged common bile duct to 20-mm compared to 15-mm 2 years  ago.  She was treated for cholangitis with intravenous antibiotics and  her liver function test improved,  and her pain resolved.  She is  undergoing ERCP and sphincterotomy in an attempt to drain the common  bile duct.   The endoscope Olympus Singleton side-viewing duodenoscope.  Sedation and  general anesthesia with propofol in operating room.   FINDINGS:  Olympus Singleton video side-viewing duodenoscope was passed  up under direct vision from the esophagus into the stomach, into the  duodenum.  Biliary papilla was visualized without difficulty and showed  normal configuration with small amount of bile exiting from the opening.  It was cannulated with some difficulty because of large periampullary  diverticulum adjacent to this ampulla.  Initially, only the main  pancreatic fields showed normal size and configuration.  Next, a guard  wire passed into the common bile duct and into the intra left and right  hepatic ducts, which appeared to be massively dilated.  Diameter of the  common bile duct was at least 20-mm.  At this point, standard  sphincterotomy was  carried out at the distal common bile duct.  There  was no bleeding.  Next, the 18-mm [employer] balloon passed under  fluoroscopic guidance into the common hepatic ducts and swept the common  bile duct with recovery of 1 about 5-mm stone. Video photographs of the  stone were obtained.  Several sweeps were obtained with occlusion  cholangiogram showing clear common bile duct.  The patient tolerated  procedure well.   IMPRESSION:  1. Choledocholithiasis status post endoscopic retrograde      cholangiopancreatography, sphincterotomy and stone extraction.  2. Normal main pancreatic duct.  3. A large periampullary diverticulum.   PLANS:  Standard orders have been written, resumption of the food as  well as rechecking of her liver function tests in the morning.     Hedwig Morton. Juanda Chance, MD  Electronically Signed    DMB/MEDQ  D:  01/12/2008  T:  01/13/2008  Job:  [id number]

## 2011-02-15 NOTE — Cardiovascular Report (Signed)
NAME:  [name] [name], [name] [name]                  ACCOUNT NO.:  [id number]   MEDICAL RECORD NO.:  [id number]          PATIENT TYPE:  INP   LOCATION:  3702                         FACILITY:  [facility]   PHYSICIAN:  Thereasa Solo. Little, M.D. DATE OF BIRTH:  03/15/1923   DATE OF PROCEDURE:  DATE OF DISCHARGE:                              CARDIAC CATHETERIZATION   INDICATIONS:  This 77 year old female has known coronary disease having had  a remote angioplasty to her circumflex and a stent placed to her proximal  LAD in 2005.  She was admitted with marked increase in her dyspnea on  exertion and some chest tightness. Cardiac enzymes particularly the  troponins were slightly elevated 0.11. CKs were negative.   After obtaining informed consent the patient was prepped and draped in the  usual sterile fashion exposing the right groin. Following local anesthetic  with 1% Xylocaine, the Seldinger technique was  employed and a 5-French  introducer sheath was placed into the right femoral artery. Left and right  coronary arteriography and ventriculography was performed. Because no  significant pathology was seen, a 7-French introducer sheath was placed into  the right femoral vein and a 7-French Swan-Ganz catheter was advanced into  the pulmonary artery for hemodynamic monitoring.   COMPLICATIONS:  None.   TOTAL CONTRAST USED:  80 cc.   EQUIPMENT:  5-French Judkins configuration diagnostic catheters and a 7-  French Swan-Ganz catheter.   RESULTS:  HEMODYNAMIC MONITORING: Right atrial pressure 14,  right  ventricular pressure 56/8. Pulmonary artery pressure 57/21 with a wedge of  20. Central aortic pressure was 138/57. Left ventricular pressure was 138/9.   VENTRICULOGRAPHY: Ventriculography in the RAO projection revealed ejection  fraction in excess of 55%.  No focal wall motion abnormalities +1 mitral  regurgitation. Left ventricular end-diastolic pressure 21.   CORONARY ARTERIOGRAPHY: Calcification of the  left main and LAD and a stent  in the proximal LAD was noted.   1.  Left main normal that bifurcated.  2.  Circumflex. The circumflex gave rise to one OM vessel and the ongoing      circumflex gave rise to OM number 2.  OM number 1 was free of disease.      The ostium of the OM number 2 after the takeoff of the OM number 1, had      an area of 20-30% narrowing. This was a previous angioplasty site.  3.  LAD. The LAD extended down to the apex of the heart. There was a stent      in the proximal portion that was widely patent with no evidence of in-      stent restenosis. The mid and distal RCA was widely patent. There was a      small first diagonal that was free of significant disease.  4.  Right coronary artery. This was a dominant vessel with minimal      irregularities in the most proximal segment. The 5-French catheter did      not damp,  cusp injections did not show any evidence of ostial  narrowing. The PDA and posterior lateral branches were free of disease.   CONCLUSION:  1.  Normal LV systolic function.  2.  +1 mitral regurgitation.  3.  Patent stent in the LAD with no high-grade stenosis in the remainder of      the coronary arteries.  4.  Severe pulmonary hypertension with pulmonary artery pressure 57/21.   I can explain her breathlessness because of pulmonary hypertension. I do not  know the explanation for her pulmonary hypertension and I have ordered  pulmonary function studies with and without bronchodilators and a CT of her  chest to make sure she does not have a pulmonary embolus.  She should be  ready for discharge later tomorrow.           ______________________________  Thereasa Solo Little, M.D.     ABL/MEDQ  D:  10/21/2005  T:  10/22/2005  Job:  [id number]   cc:   Lucky Cowboy, M.D.  Fax: [phone]   Cardiac Catheterization Lab   Gaspar Garbe B. Little, M.D.  Fax: [phone]

## 2011-02-15 NOTE — H&P (Signed)
NAMEEH, SAUSEDA NO.:  [id number]   MEDICAL RECORD NO.:  [id number]          PATIENT TYPE:  EMS   LOCATION:  MAJO                         FACILITY:  [facility]   PHYSICIAN:  Ulyses Amor, MD DATE OF BIRTH:  June 15, 1923   DATE OF ADMISSION:  08/22/2004  DATE OF DISCHARGE:                                HISTORY & PHYSICAL   IDENTIFYING DATA AND CHIEF COMPLAINT:  [name] [name] is a 77 year old white  woman who is admitted to [facility] for further evaluation of chest  pain.   HISTORY OF PRESENT ILLNESS:  The patient has a history of coronary artery  disease, which dates back tom 1996.  At that time she underwent placement of  a stent to the LAD.  In August 2004 she returned for a cutting balloon  angioplasty to the second obtuse marginal.  Her course has been  uncomplicated since then.   The patient presented to the emergency department after experiencing an  episode of chest pain, which began this evening after dinner.  She was  sitting at the time.  Her chest pain is described as a pressure and  indigestion discomfort located in heart rate lower substernal region.  It  did not radiate.  It is not associated with dyspnea, diaphoresis or nausea.  There were no exacerbating or ameliorating factors.  It appears not to be  related to position, activity, meals or respirations. She did not take any  nitroglycerin at home.  The chest discomfort persisted in a low-grade  fashion throughout the evening and ultimately prompted her visit to the  emergency department.  Her chest discomfort is largely, though not  completely,  resolved at this time.  She otherwise feels well.   PAST MEDICAL HISTORY:  1.  There is no history of congestive heart failure.  2.  There is a history of supraventricular tachycardia.  3.  The patient has also has a history of hypertension and dyslipidemia.  4.  There is no history of diabetes mellitus, smoking or family history of  early coronary artery disease.   PAST SURGICAL HISTORY:  Previous operations include a hysterectomy.   MEDICATIONS:  Lipitor, Plavix and an unknown diuretic.   ACCIDENTS AND INJURIES:  Significant injuries - none.   SOCIAL HISTORY:  The patient lives with her husband.  She does not work.  She neither drinks or smokes.   FAMILY HISTORY:  There is no family history of early coronary artery  disease.   REVIEW OF SYSTEMS:  Review of systems reveals no problems related to her  head, eyes, ears, nose, mouth, throat, lungs, gastrointestinal system,  genitourinary system, or extremities.  There is no history of neurologic or  psychiatric disorder.  There is no history of fever, chills or weight loss.   PHYSICAL EXAMINATION:  VITAL SIGNS:  Blood pressure 137/51, pulse 61 and  regular, respirations 19 and temperature 97.0.  GENERAL APPEARANCE:  This patient is an elderly white woman in no  discomfort.  She is alert, oriented, appropriate and responsive.  HEENT:  Head, eyes, nose and  mouth are normal.  NECK:  The neck is without thyromegaly or adenopathy.  Carotid pulses  palpable bilaterally and without bruits.  HEART:  Cardiac examination reveals a normal S1 and S2.  There is no S3, S4,  murmur, rub, or click.  The cardiac rhythm is regular.  CHEST:  No chest wall tenderness is noted.  LUNGS:  The lungs are clear.  ABDOMEN:  The abdomen is soft and nontender.  There is no mass,  hepatosplenomegaly, bruit, distention, rebound, guarding, or rigidity.  Bowel sounds are normal.  BREASTS, PELVIC AND RECTAL:  Breast, pelvic and rectal examinations are not  performed as they are not pertinent to the reason for acute care  hospitalization.  EXTREMITIES:  The extremities are without edema, deviation or deformity.  Radial and dorsalis pedal pulses are palpable bilaterally.  NEUROLOGIC EXAMINATION:  Brief screening neurologic survey is unremarkable.   LABORATORY DATA:  The electrocardiogram  revealed normal sinus rhythm; the  tracing was normal.  A chest radiograph according to the radiologist  demonstrated an elevated right hemidiaphragm and bibasilar atelectasis.  Potassium is 3,5, BUN 15 and creatinine 1.0.  The initial set of cardiac  markers revealed a myoglobin of 49.7, CK MB 1.2 and troponin less 0.05.  The  second set of cardiac markers  revealed a myoglobin of 59.8, CK MB less than  1.0 and troponin less tan 0.05. The remaining studies are pending at the  time of this dictation.   IMPRESSION:  1.  Chest pain, rule out unstable angina.  2.  Coronary artery disease status post left anterior descending stent in      1996, status post cutting balloon angioplasty to the second obtuse      marginal in August 2004.  3.  Hypertension.  4.  Dyslipidemia.  5.  History of supraventricular tachycardia.  6.  Chronic obstructive pulmonary disease.   PLAN:  1.  Telemetry.  2.  Serial cardiac enzymes.  3.  Aspirin.  4.  Continue Plavix.  5.  Intravenous heparin.  6.  Intravenous nitroglycerin .  7.  Further measures per Dr. Clarene Duke.   The patient's husband is present throughout the patient's encounter.      Mitc   MSC/MEDQ  D:  08/22/2004  T:  08/22/2004  Job:  [id number]   cc:   Thereasa Solo. Little, M.D.  [address]  [address]  [place]  [place] [address]  Fax: [phone]

## 2011-02-15 NOTE — Consult Note (Signed)
NAME:  [name] [name], [name] [name]                  ACCOUNT NO.:  [id number]   MEDICAL RECORD NO.:  [id number]          PATIENT TYPE:  OUT   LOCATION:  XRAY                         FACILITY:  [facility]   PHYSICIAN:  Sanjeev K. Deveshwar, M.D.DATE OF BIRTH:  05-17-1923   DATE OF CONSULTATION:  DATE OF DISCHARGE:                                   CONSULTATION   BRIEF ADDENDUM:  The patient states that she is almost out of Vicodin.  She  has been taking 7.5/500 mg one every 4-6 hours.  The patient and her family  state that this has not been giving her any relief.  Dr. Corliss Skains  recommended taking two tablets every 6 hours and taking ibuprofen or Tylenol  in between.  He also recommended that she take a stool softener or  combination of stool softener and laxative to help with constipation while  taking narcotic medication.  He also recommended that she increase her  fluids as much as possible in an effort to avoid constipation.  We did give  her a new prescription for Vicodin 7.5/500 mg, #50, to be taken two q.6h.  p.r.n. pain, no refills.      Delton See, P.A.    ______________________________  Grandville Silos. Corliss Skains, M.D.    DR/MEDQ  D:  07/21/2006  T:  07/22/2006  Job:  [id number]   cc:   Tia Alert, MD  Lucky Cowboy, M.D.

## 2011-02-15 NOTE — Consult Note (Signed)
NAME:  [name] [name], [name] [name]                  ACCOUNT NO.:  [id number]   MEDICAL RECORD NO.:  [id number]          PATIENT TYPE:  OUT   LOCATION:  XRAY                         FACILITY:  [facility]   PHYSICIAN:  Sanjeev K. Deveshwar, M.D.DATE OF BIRTH:  07-23-1923   DATE OF CONSULTATION:  DATE OF DISCHARGE:                                   CONSULTATION   CHIEF COMPLAINT:  Status post L1 kyphoplasty and L4 vertebroplasty performed  July 16, 2006   HISTORY OF PRESENT ILLNESS:  This is a pleasant 77 year old female referred  to Dr. Corliss Skains through the courtesy of Dr. Marikay Alar after a CT scan on  October 9 showed compression fractures at the L1 and L4 levels.  The patient  has been evaluated by Dr. Yetta Barre for back pain.  She does have spinal  stenosis at L2-L3, L3-L4 and L4-L5. Dr. Yetta Barre spent considering spinal  fusion from L3 to L5, however, he recommended addressing her compression  fractures prior to surgery.   On July 16, 2006, the patient underwent a kyphoplasty at the L1 level as  well as a vertebroplasty at the L4 level performed by Dr. Corliss Skains. The  patient felt better initially, however, several days later she developed  severe pain once again at the same general level although somewhat to the  right of her spine.  The patient has taken Vicodin without relief.  She only  feels better when she lies in a certain position. She is seen today with her  husband and daughter for further evaluation.   PAST MEDICAL HISTORY:  Significant for hyperlipidemia, hypertension, COPD.  She has history of a pulmonary embolus in January 2007.  She is currently on  Coumadin therapy.  This was stopped briefly for her recent kyphoplasty and  vertebroplasty procedures.  She has a history of coronary artery disease  followed by Dr. Julieanne Manson.  She had a previous LAD stent placed in  January 2007.  She had a history of paroxysmal supraventricular tachycardia.  She has an ejection fraction estimated  to be 50-55%.  The patient has a  history of pancreatitis and cholecystitis. She had a cholecystectomy  performed in July 2007.  Other surgical history significant for a  hysterectomy and a lumbar laminectomy 4-5 years ago.  The patient denies any  previous complications with anesthesia.   ALLERGIES:  No known drug allergies.  She denies allergy to contrast,  shrimp, iodine, shellfish or latex.   CURRENT MEDICATIONS:  The patient did not bring her medications, although  according to the patient and her family, she is currently on Coumadin,  hydrocodone, metoprolol, diltiazem, Vytorin, and aspirin.   SOCIAL HISTORY:  The patient is married.  She has two children.  She lives  in [place] with her husband.  She does not smoke or use alcohol.   FAMILY HISTORY:  Her mother died at age 60 from natural causes. Her father  died at age 93 from natural causes.   LABORATORY DATA:  At the time of her recent kyphoplasty and vertebroplasty  procedures, hemoglobin was 11.6, hematocrit 36.9, WBCs 9000,  platelets  607,000.  An INR was 1.3, a PTT was 30. BUN was 14, creatinine was 0.7,  potassium was 4, glucose 104.   IMPRESSION AND PLAN:  As noted, the patient returns today to discuss new  back pain with Dr. Corliss Skains. She is accompanied by her husband and her  daughter. Her pain is quite severe. Dr. Corliss Skains reviewed the results of  the recent CT scan as well as the recent vertebroplasty and kyphoplasty  procedures.  He is concerned that the patient may have developed a  subsequent fracture.  We have ordered an MRI today to further evaluate the  patient. If she does have a new fracture, the patient is anxious to proceed  with kyphoplasty or vertebroplasty if felt to be safe and indicated for  further relief of pain.  Dr. Corliss Skains will discuss the case with Dr. Marikay Alar prior to proceeding.  She is currently back on her Coumadin. This will  need be held, once again, if she needs to undergo  another intervention.  We  will await the results of the most recent MRI. Greater than 15 minutes was  spent on this consult.      Delton See, P.A.    ______________________________  Grandville Silos. Corliss Skains, M.D.    DR/MEDQ  D:  07/21/2006  T:  07/22/2006  Job:  [id number]   cc:   Tia Alert, MD  Lucky Cowboy, M.D.

## 2011-02-15 NOTE — Discharge Summary (Signed)
NAMEKEOSHA, ROSSA                  ACCOUNT NO.:  [id number]   MEDICAL RECORD NO.:  [id number]          PATIENT TYPE:  INP   LOCATION:  2010                         FACILITY:  [facility]   PHYSICIAN:  Nelma Rothman, MD   DATE OF BIRTH:  [birth year]   DATE OF ADMISSION:  12/12/2005  DATE OF DISCHARGE:  12/20/2005                                 DISCHARGE SUMMARY   PRIMARY CARE PHYSICIAN:  Dr. Oneta Rack.   CONSULTING PHYSICIAN:  Dr. Clarene Duke of [facility]  Dr. Ezzard Standing and Dr. Harriette Bouillon of [facility] surgery.   DISCHARGE DIAGNOSES:  1.  Acute cholecystitis.  2.  Biliary pancreatitis.  3.  Escherichia coli secondary to above.  4.  Recent history of pulmonary embolus January2007 on Coumadin.  5.  Hypertension.  6.  Coronary artery disease.  7.  Dyslipidemia.  8.  History of paroxysmal supraventricular tachycardia.   PROCEDURES:  1.  On admission, portable chest x-ray demonstrated chronic elevation of the      right hemidiaphragm but no acute findings.  2.  Ultrasound of the abdomen on admission demonstrated no definite      gallbladder abnormality or other acute findings, although prominence of      the gallbladder wall was noted and the study was somewhat limited      secondary to body habitus and large amount of bowel gas, recommending CT      for further evaluation.  3.  CT of the abdomen and pelvis without contrast on March15, 2007      demonstrated small right pleural effusion and basilar atelectasis as      well as peripancreatic ascites, biliary dilatation, mild changes of      pancreatitis, extensive inflammatory process in the right upper      quadrant, question acute cholecystitis and can not exclude gangrenous      cholecystitis and right upper quadrant abscess. There was also duodenal      wall thickening which was possibly reactive in nature and sigmoid      diverticulosis.  4.  Placement of percutaneous cholecystostomy tube by  interventional      radiology on March17, 2007.  5.  Serial portable chest x-rays demonstrated bibasilar atelectasis and      right-sided pleural effusion.   LABORATORY DATA:  White blood cell count initially was 8.2 and then rose to  24.4 the morning following admission.  It was trending down until she was  started on steroids for reactive airway disease at which time it started  trending up again. White blood cell count on the morning of discharge was  16.0. INR the morning of discharge was 2.4, creatinine 1.1. On admission,  lipase 1129. Liver function tests had demonstrated total bilirubin of 2.0,  alkaline phosphatase 89, AST 641, ALT 344, all of which are trending down at  the time of dictation. Total cholesterol 94, triglycerides 64, HDL 44, LDL  37. TSH was 1.883. CEA was 3.6. Gallbladder fluid culture demonstrated and  multiple organisms present but none predominant. Blood cultures did grow E-  coli which was sensitive to Zosyn as well as ciprofloxacin.   HISTORY AND PHYSICAL:  See dictated admission history and physical, but  briefly, Mrs. Clodfelter is a very pleasant 77 year old lady who presented to the  [facility] Emergency Department with abdominal pain and nausea. Liver  function tests were concerning for biliary pancreatitis in addition to acute  cholecystitis. She was admitted for further evaluation and management.   HOSPITAL COURSE:  Mrs. Laramee was admitted to the hospital and started on  intravenous Zosyn. Surgery consult was promptly called for evaluation of  above-noted CT scan findings. They felt that her presentation was most  consistent with acute cholecystitis and superimposed biliary pancreatitis.  Given the severity of illness at that time, they felt it was most  appropriate for a biliary drain to be placed by interventional radiology  which was done on March17, 2007. She was aggressively IV fluid resuscitated  in the meantime. Since biliary drain was placed, she  has remained completely  hemodynamically stable and afebrile. White blood cell count was trending  down prior to initiation of steroids which will be discussed below. The  patient will be discharged today with a biliary drain in place. She will  follow up with Dr. Ezzard Standing in the [facility] in  approximately 2-4 weeks for discussion of outpatient cholecystectomy. Her  biliary drain is to stay in place until follow up with Dr. Ezzard Standing and will  be managed primarily by a home health nurse. She will be discharged on an  additional seven days of oral ciprofloxacin to complete a 14-day course of  antibiotics. With regard to her pancreatitis, it improved dramatically  within 48 hours after admission, and she was started on a clear liquid diet  which was then advanced as tolerated.   Mrs. Ehrsam did develop some respiratory distress few days after admission.  Chest x-ray at that time demonstrated bibasilar atelectasis and a right-  sided pleural effusion but no overt congestive heart failure. A BNP was  mildly elevated, yet she had no clinical improvement in her shortness of  breath with intravenous Lasix. There was diffuse wheezing on exam. This was  felt most likely consistent with reactive airway disease and she was started  on a burst of Solu-Medrol. The following morning, she was markedly improved  and wheezing was only mild.  She will be discharged to complete a taper of  prednisone over the next six days as prescribed.   With regard to her history of coronary artery disease, [facility], Dr. Clarene Duke and his colleagues, followed along while Mrs. Umholtz  was in the hospital with regard to her cardiac status and fluid management.  She was maintained on diltiazem and metoprolol during her stay. She has  tolerated this well. There have been no changes in her chronic cardiac  medications and she will resume all them as previously prescribed.  Mrs. Bowditch did  have a recently diagnosed pulmonary embolism approximately  two months ago. Her Coumadin was initially held on admission secondary to  concern for an emergent cholecystectomy. She was placed on unfractionated  heparin drip on which she was continued for a few days after percutaneous  drain placement when it became clear that surgery could be postponed until  an outpatient. At that time, she was restarted on Coumadin and today her INR  is 2.4. Since she is taking Cipro, I suspect that her Coumadin dose will be  lower then she was taking previously.  For that reason, I have asked her to  take 2.5 milligrams again tonight, then no Coumadin on Saturday, March24th,  then 2.5 milligrams on Sunday March25th, and then the home health nurse will  be drawing an INR level and with results faxed to Dr. Clarene Duke on Monday,  March26th. Her previous dose was 2.5 milligrams every day of the week except  two days when she took 5 milligrams. However, it has only taken her three  days to get to an INR of 2.4, so I suspect that her Coumadin requirements  will be substantially lower for the time being while she is on Cipro, hence  I have asked her to hold her Coumadin on Saturday so as not to cause a  supratherapeutic INR.   DISCHARGE MEDICATIONS:  1.  Ciprofloxacin 500 milligrams p.o. b.i.d. x7 days.  2.  Fluconazole 100 milligrams p.o. daily x7 days.  3.  Prednisone Dosepak 6 days 10 mg taper 60 to 10 milligrams over the      course of 6 days.  4.  She is to resume all of her home medications as previous.   DISCHARGE INSTRUCTIONS:  She will be discharged home today with home health  physical therapy, occupational therapy, nurse for management of her biliary  drain as well as an aide. She is instructed to flush her  biliary drain at least daily and record output. The drain should remain in  place until she follows up with Dr. Ezzard Standing in approximately two to four  weeks. She is to call [phone] to make this  appointment. She will also  follow up with Dr. Oneta Rack, her primary care Kael Keetch, within the next few  weeks for hospital follow-up.      Nelma Rothman, MD  Electronically Signed     RAR/MEDQ  D:  12/20/2005  T:  12/21/2005  Job:  [id number]   cc:   Lucky Cowboy, M.D.  Fax: [phone]   Thereasa Solo. Little, M.D.  Fax: [phone]   Clovis Pu. Cornett, M.D.  [address] [place] [address]  [place] [address]

## 2011-02-15 NOTE — Discharge Summary (Signed)
[place]  Patient:    [name] [name], [name] [name]                         MRN: [id number] Adm. Date:  [id number] Disc. Date: 05/15/00 Attending:  Maryanna Shape CC:         Thereasa Solo. Little, M.D.   Discharge Summary  ADMITTING DIAGNOSES: 1. Severe stenosis at L4-5, moderate stenosis at L3-4, multifactorial. 2. Osteoporosis. 3. High blood pressure. 4. Hypercholesterolemia.  DISCHARGE DIAGNOSES: 1. Severe stenosis at L4-5, moderate stenosis at L3-4, multifactorial. 2. Osteoporosis. 3. High blood pressure. 4. Hypercholesterolemia.  OPERATIVE PROCEDURE:  Complete laminectomy of L4, partial laminectomy of L3 and L5, and decompression centrally, posterolaterally at L3-4 and L4-5, exposing the L3 and the L4 nerve roots bilaterally.  HOSPITAL COURSE:  On the day of admission the patient was taken to the operating room and the operative procedure was carried out, as detailed in the operative note on May 13, 2000.  The first day postoperative the patient had ambulated with a walker and generally felt weak.  Her lower extremity pain was obliterated.  She ended up with some tenderness over the right trochanteric bursa which I feel is secondary to use of the operative frame.  Presently her IV has been discontinued.  She is voiding well.  She continues to have no pain in the left lower extremity.  She is somewhat tender over the trochanteric bursa in the right hip.  She is ready for discharge.  She has ambulated some without a walker, and ambulated some with a walker.  DISPOSITION:  She is being discharged, with instructions to walk with a walker for a few days, to wear her TED socks as tolerated, and wear elastic rib belt as a support corset as tolerated.  DISCHARGE MEDICATIONS:  She will be on all of her usual medications, except Dr. Gaspar Garbe B. Little has stopped her from taking Baycol, and will have her on Lipitor 10 mg once q.d.  FOLLOWUP:  I will see  her in the office in approximately one week.  She will call sooner with concerns or problems.  LABORATORY DATA:  Some of the laboratory data obtained during this stay is as follows:  CBC normal, the hematocrit 41, hemoglobin 13.7.  INR 1.1, PTT 24. Electrolytes were normal, potassium 140, K of 5.0, glucose 105, total protein 6.7.  Urinalysis normal.  Chest x-ray:  Elevation of the right hemidiaphragm, left base atelectasis.  Electrocardiogram:  Normal sinus rhythm, low voltage QRS, borderline electrocardiogram. DD:  05/15/00 TD:  05/15/00 Job: [id number]

## 2011-02-15 NOTE — Discharge Summary (Signed)
NAME:  [name] [name], [name] [name]                  ACCOUNT NO.:  [id number]   MEDICAL RECORD NO.:  [id number]          PATIENT TYPE:  INP   LOCATION:  3702                         FACILITY:  [facility]   PHYSICIAN:  Thereasa Solo. Little, M.D. DATE OF BIRTH:  [birth year]   DATE OF ADMISSION:  10/18/2005  DATE OF DISCHARGE:  10/27/2005                                 DISCHARGE SUMMARY   DISCHARGE DIAGNOSES:  1.  Known coronary artery disease, status post catheterization during this      admission.  2.  Multiple pulmonary emboli by chest CT - on Coumadin therapy.  3.  Deep venous thrombosis of the posterior tibial vein.  4.  Hypertension.  5.  Dyslipidemia.  6.  Chronic obstructive pulmonary disease.  7.  Obesity.  8.  History of paroxysmal supraventricular tachycardia.   PROCEDURES:  1.  Cardiac catheterization performed on October 21, 2005, revealed normal      left main, circumflex had 20 to 30% stenosis in obtuse marginal branch      #1, LAD widely patent in the stented portion, no in-stent restenosis.      RCA was patent and small first diagonal was also free of disease.   Her ejection fraction was normal and there was a +1 mitral regurgitation and  severe pulmonary hypertension with pulmonary artery pressure 57/21.   1.  Lower extremity Doppler and Ultrasound performed on October 22, 2005,      revealed DVT of the left posterior tibial vein and right peroneal vein.   1.  Chest CT was performed on October 22, 2005, and revealed multiple      bilateral central and segmental pulmonary emboli findings as well as      small pericardial effusion.   HOSPITAL COURSE:  This is an 77 year old Caucasian female patient of Dr.  Clarene Duke who was seen in the emergency room with complaints of rapid heart  rate and increased work of breathing and tightness in the chest.  Patient  was admitted to telemetry unit.  Her EKG revealed sinus tachycardia and  blood pressure was 120/50.  We cycled cardiac enzymes and they  were normal.   On October 21, 2005, patient underwent cardiac catheterization that did not  reveal any significant progression of the coronary disease, but at the same  time her pulmonary artery pressure was significantly elevated and Dr. Clarene Duke  ordered a CT of her chest to further evaluate her complaints of shortness of  breath and pulmonary hypertension. The CT was performed the same day  revealed multiple pulmonary emboli and ultrasound of the lower extremities  revealed bilateral lower extremity DVTs.  Patient was started on Coumadin  anticoagulation therapy and we held her in the hospital for a few days until  her INR became therapeutic.  Breathing improved and she did not have  complaints of shortness of breath on the day of discharge and voiced her  desire to be discharged home.   LABORATORY DATA:  Sodium was 139, potassium 4.0, chloride 104, CO2 29,  glucose 24, BUN 16, creatinine 1.0, calcium 8.7.  Hemoglobin was  10.9,  hematocrit 31.8, white blood cell count 6.7, platelets 219.  TSH was 1.520.  Lipid profile showed 137, triglycerides 57, HDL 46, LDL 80.  INR at the day  of discharge was 2.4.  She was assessed by Dr. Allyson Sabal and felt to be stable  for discharge home.   DISCHARGE MEDICATIONS:  1.  Aspirin 81 mg daily.  2.  Protonix 40 mg daily.  3.  Cardizem 120 mg daily.  4.  Mavik 10 mg daily.  5.  Vytorin 10/20 mg daily.  6.  Lopressor 12.5 mg b.i.d.  7.  Coumadin 5 mg daily.   Patient was discharged home on low salt, fat and cholesterol diet.   FOLLOW UP:  In our office, appointment with Dr. Clarene Duke will be scheduled in  two or three weeks and office is to contact patient to schedule that  appointment.      Raymon Mutton, P.A.    ______________________________  Thereasa Solo. Little, M.D.    MK/MEDQ  D:  10/27/2005  T:  10/28/2005  Job:  [id number]   cc:   [facility]

## 2011-02-15 NOTE — Consult Note (Signed)
[name], [name]                            ACCOUNT NO.:  [id number]   MEDICAL RECORD NO.:  [id number]                   PATIENT TYPE:  INP   LOCATION:  6525                                 FACILITY:  [facility]   PHYSICIAN:  [name] [name]. [name] [name], M.D.               DATE OF BIRTH:  1923-09-17   DATE OF CONSULTATION:  05/04/2003  DATE OF DISCHARGE:                                   CONSULTATION   CONSULTING PHYSICIAN:  [name] [name].   INDICATION FOR CONSULTATION:  Evaluation of symptomatic palpitations  associated with SVT.   HISTORY OF PRESENT ILLNESS:  The patient is a 77 year old woman who has a  history of coronary artery disease, status post myocardial infarction, a  history of hypertension, a history of epistaxis in the past, who was  admitted to the hospital with palpitations and increasing dyspnea.  Subsequent evaluation demonstrated an 80-90% ostial obtuse marginal stenosis  which was treated with angioplasty.  Because of her palpitations and  documented brief runs of nonsustained atrial tachycardia, the patient was  initially, after being refractory to beta blockers, started on amiodarone.  Subsequent pulmonary function testing, however, has demonstrated a moderate  diffusion defect and amiodarone has been discontinued.  The patient states  that she has had no syncope.  She has been anxious about her palpitations,  but other than the anxiety associated with them, has been not particularly  symptomatic.  She does have baseline dyspnea.   PAST MEDICAL HISTORY:  Past medical history is as previously noted.   FAMILY HISTORY:  The family history is noncontributory.   SOCIAL HISTORY:  The patient is married and retired.  She denies tobacco or  ethanol use.   REVIEW OF SYSTEMS:  Review of systems is notable for no fevers, chills or  night sweats.  She denies vision or hearing problems.  She does have chronic  shortness of breath and dyspnea.  She has palpitations, as  noted.  She has  arthralgias and she has generalized weakness.  The rest of her review of  systems was negative.  Please see [name] [name]'s note for additional details.   PHYSICAL EXAMINATION:  GENERAL:  On physical exam, she is a pleasant, well-  appearing, elderly obese woman in no acute distress.  VITAL SIGNS:  The blood pressure was 100/50, the pulse 65 and irregular,  respirations were 20.  Weight was 212 pounds.  HEENT:  Normocephalic and atraumatic.  The pupils were equal and round.  The  oropharynx was moist.  The sclerae were anicteric.  NECK:  Neck revealed no jugular venous distention.  LUNGS:  The lungs were clear bilaterally.  CARDIOVASCULAR:  The cardiovascular exam revealed an irregular rate and  rhythm with normal S1 and S2.  ABDOMEN:  Soft and nontender and there is no organomegaly.  EXTREMITIES:  Extremities demonstrated no cyanosis, clubbing or edema.  NEUROLOGIC:  Neurologically, she is alert and oriented x2 with cranial  nerves II-XII grossly intact.   LABORATORY AND ACCESSORY CLINICAL DATA:  Her EKG demonstrates normal sinus  rhythm with frequent PACs, some of which are consecutive.  The P wave  morphology of the premature PACs is positive in the inferior leads and  isoelectric in leads aVL and aVR, suggestive of a high left atrial focus,  perhaps near the right superior pulmonary vein or perhaps near the superior  vena cava.   IMPRESSION:  1. Recurrent brief runs of consecutive premature atrial contractions     consistent with nonsustained atrial tachycardia, questionable whether     this is multifocal or unifocal.  2. Coronary artery disease, status post angioplasty with stenting.  3. Prior myocardial infarction with preserved left ventricular function.  4. Hypertension.  5. Lung disease with reduced diffusion capacity.   DISCUSSION:  The patient has remained fairly symptomatic with her atrial  arrhythmias.  However, part of her concern is whether or not  these were in  fact dangerous.  I discussed the benign nature of her palpitations with the  patient.  She has not responded well to beta blockers or amiodarone and I  think trying a calcium channel blocker alone would be reasonable.  I would  hold off on digoxin, as this sometimes increases atrial automaticity.  If  her symptoms worsen, Rythmol would be an option, although I would be  reluctant to recommend this drug in the setting of underlying coronary  disease.  She is certainly at risk for atrial fibrillation with her  recurrent atrial arrhythmias.  She should be followed closely for atrial  fibrillation, as Coumadin would be strongly considered if she develops  sustained atrial fibrillation.  I do not think that her present ECGs are  demonstrative of atrial fibrillation, but rather atrial tachycardia.                                               [name] [name]. [name] [name], M.D.    GWT/MEDQ  D:  05/04/2003  T:  05/05/2003  Job:  [id number]   cc:   Lucky Cowboy, M.D.  [address]  [place], [place] [address]  Fax: [phone]   Kathrine Cords, R.N. [facility]

## 2011-02-15 NOTE — Discharge Summary (Signed)
NAME:  [name] [name], [name] [name]                            ACCOUNT NO.:  [id number]   MEDICAL RECORD NO.:  [id number]                   PATIENT TYPE:  INP   LOCATION:  3737                                 FACILITY:  [facility]   PHYSICIAN:  Thereasa Solo. Little, M.D.              DATE OF BIRTH:  [birth year]   DATE OF ADMISSION:  04/29/2003  DATE OF DISCHARGE:  05/05/2003                                 DISCHARGE SUMMARY   ADMISSION DIAGNOSES:  Palpitations.   DISCHARGE DIAGNOSES:  1. Multifocal atrial tachycardia - supraventricular arrhythmias.  2. Coronary artery disease.  3. Chronic pulmonary obstructive disease  4. Hypertension.  5. Hyperlipidemia.   PROCEDURE:  Left heart catheterization and cutting balloon to the ostium of  the second marginal vessel on May 03, 2003.   COMPLICATIONS:  None.   DISCHARGE STATUS:  Improved.   HISTORY OF PRESENT ILLNESS:  See note on chart but briefly, this 77 year old  female developed intermittent palpitations the day prior to admission.  She  presented to the emergency room with mild breathlessness and palpations.  She was having three, four and five-beat episodes of a supraventricular  arrhythmia, either SVR or atrial tachycardia.   PAST MEDICAL HISTORY:  1. Anterior myocardial infarction in 1996 with a stent to her LAD.  2. Hypertension.  3. She had a significant nose bleed in the distant past from heparin.   MEDICATIONS:  She was on Accupril, Bumex, Lipitor and Tranxene.   ADMISSION PHYSICAL EXAMINATION:  VITAL SIGNS: Her blood pressure was 161/60,  her heart rate was 70 with marked ectopy, oxygen saturations were 92% on  room air and she was afebrile.  NECK: She had no carotid bruits.  LUNGS: Her lungs were clear, no rhonchi or wheezes.  HEART: Cardiac regular rhythm, no murmur.  ABDOMEN: Was obese.  EXTREMITIES: She had diffuse tenderness over her lower extremities an good  pulses.   HOSPITAL COURSE:  She was initially admitted an started on  Lovenox SQ and  continued on aspirin and then started on Lopressor 50 mg b.i.d. with the  plan that this would suppress her arrhythmia.  It did not.  She was started  on amiodarone the next day because of concerns that this could actually be  atrial fibrillation.  Despite amiodarone and Lopressor she continued to have  recurrent supraventricular, only four to five beat in duration but  symptomatic with the awareness.  Over the weekend, two days after she was  admitted, she began talking about marked breathlessness with minimal  activity and significant fatigue.  In view of this she underwent pulmonary  function studies and a cardiac catheterization.  Her cardiac catheterization  showed the stent in her LAD to be widely patent but there was ostial  narrowing of the second marginal, this was treated with a cutting balloon  with an excellent result.  She had normal left ventricular systolic  function.  Her PFTs showed significant reduction in defusion capacities and  rather significant underlying COPD.  She did not have a past history of  cigarette abuse and she did not work in a [employer].   Because of the reduced defusion capacities the amiodarone was withdrawn and  the beta blockers were withdrawn and she was started on short acting  Cardizem.  She has pulses as slow as 50 when the Cardizem was first started  but she had been on beta blockers and amiodarone and I suspect the  bradycardia was added to the effect of all the medications and I doubt the  bradycardia will be a substantial problem.  Her slowest pulse was only 50  and she was asymptomatic.   Dr. Ladona Ridgel from EP saw the patient and felt that her rhythm was relatively  benign although she is symptomatic with this.  The calcium channel blockers  were an adequate choice in that if she were to develop more bradycardia or  atrial fibrillation then she would need more aggressive management.   At the time of discharge she is in a  sinus rhythm.  She has two and three  beat episodes of supraventricular arrhythmias that she is aware of but no  symptomatic with.  She has had no chest pain and she is having no problems  at her catheterization site.  She is ready for discharge.   SIGNIFICANT RESULTS:  A 2-D echo shows 50% ejection fraction, left atrial  dimension 4.0 cm.  Pulmonary function study shows her FEV1 to be 60%  predicted as was her FVC, it improved to 81% and 74% respectively with  bronchodilators.   Her hemoglobin was 13.1, white blood cell count 8300.  BNP was normal.  Cardiac enzymes were negative.  TSH normal at 2.7.  Cardiac markers were  negative.  Chest x-ray showed mild cardiomegaly, elevation of the right  hemidiaphragm but unchanged from previous study of August, 2001.  EKG shows  low voltage QRS, otherwise normal.  Repeat EKG shows sinus rhythm with  supraventricular ectopy.   DISCHARGE MEDICATIONS:  1. Lipitor 10 mg q.h.s.  2. Plavix 75 mg one daily for 30 days.  3. Enteric coated aspirin 81 mg daily.  4. Cardizem CD 120 mg.  5. Albuterol two puffs q.i.d.  6. Tranxene 7.5 mg p.r.n.  7. She was instructed to stop her Accupril and her Bumex because of relative     low blood pressure  when using combination with the beta blockers and     calcium channel blockers.   ACTIVITIES:  Are limited but as tolerated.   DIET:  She is to stay on a low salt, low fat diet.    FOLLOW UP:  I plan to see her in my office in two to three weeks and we will  set up an outpatient appointment for her to see Dr. Maple Hudson from a pulmonary  stand-point.                                                Thereasa Solo. Little, M.D.    ABL/MEDQ  D:  05/05/2003  T:  05/06/2003  Job:  [id number]   cc:   Lucky Cowboy, M.D.  [address], [address]  [place], [place] [address]  Fax: [phone]   Clinton D. Young, M.D.  [address]  [address]  [place] [address]  Fax: [phone]

## 2011-02-15 NOTE — Consult Note (Signed)
NAME:  [name] [name], [name] [name]                  ACCOUNT NO.:  [id number]   MEDICAL RECORD NO.:  [id number]          PATIENT TYPE:  INP   LOCATION:  3308                         FACILITY:  [facility]   PHYSICIAN:  Maisie Fus A. Cornett, M.D.DATE OF BIRTH:  December 04, 1922   DATE OF CONSULTATION:  DATE OF DISCHARGE:                                   CONSULTATION   ADDENDUM:  In addition to the plan of care, after discussion with Dr.  Luisa Hart, he feels at this time that acute surgical intervention is not  indicated due to the biliary pancreatitis.  We do need to allow this  situation to cool down or resolve.  We have asked interventional radiology  to come and evaluate the patient for possible insertion of percutaneous  drain or aspiration of the fluid around the gallbladder.  This will help  minimize the patient's pain, relieve or decompress the fluid and hopefully  help with the acute infectious process in addition to measures already in  place including antibiotics.      Allison L. Rennis Harding, N.P.      Thomas A. Cornett, M.D.  Electronically Signed    ALE/MEDQ  D:  12/13/2005  T:  12/15/2005  Job:  [id number]

## 2011-02-15 NOTE — H&P (Signed)
[name] [name], [name] [name]                  ACCOUNT NO.:  [id number]   MEDICAL RECORD NO.:  [id number]          PATIENT TYPE:  EMS   LOCATION:  MAJO                         FACILITY:  [facility]   PHYSICIAN:  Lonia Blood, M.D.      DATE OF BIRTH:  [birth year]   DATE OF ADMISSION:  12/12/2005  DATE OF DISCHARGE:                                HISTORY & PHYSICAL   CHIEF COMPLAINT:  Abdominal pain and nausea.   HISTORY OF PRESENT ILLNESS:  The patient is an 77 year old with known  history of coronary artery disease, hypertension, and dyslipidemia, who  apparently was doing fine until this morning when she started having sudden  epigastric pain.  The pain was so severe that the patient was nauseated and  unable to eat.  She subsequently had come to the emergency room where a  preliminary workup showed her lipase was more than 2000.  Hence, we are  called to admit the patient.  The patient denied any vomiting or diarrhea.  She denied any new changes except for travel.  Yesterday, they traveled  about 200 miles with her husband, otherwise, no new medications.  She also  denied any fever or diaphoresis.   PAST MEDICAL HISTORY:  Hypertension, morbid obesity, history of PE in  January 2007, she is currently on Coumadin, coronary artery disease status  post recent cath on October 21, 2005, by Dr. Caprice Kluver which shows normal  left main, the circumflex had 20-30% stenosis.  Otherwise, no critical  stenosis.  She also had a normal EF.  History of dyslipidemia, COPD, history  of PSVT.   ALLERGIES:  The patient has no known drug allergies.   CURRENT MEDICATIONS:  Aspirin 81 mg daily, Bumex 1 mg, she takes 1/2 tablet  daily, Metoprolol 25 mg b.i.d., Cardizem 125 mg daily, Coumadin, she takes 5  mg two days a week, the rest of the week she takes 2.5 mg, Vytorin 10/20, 1  tablet at bedtime, Mavik 4 mg daily.   SOCIAL HISTORY:  The patient is married, lives with her husband.  No tobacco  or alcohol use.  She  is fairly active, otherwise.   FAMILY HISTORY:  The patient denied any family history of abdominal problems  including pancreatitis or cancer.   REVIEW OF SYSTEMS:  A ten point review of systems was performed.  Mainly,  the patient describes a bad taste in her mouth when she belches, otherwise,  the rest is per HPI.   PHYSICAL EXAMINATION:  VITAL SIGNS:  Temperature 97, blood pressure 100/62, pulse 81 initially, now  110, respiratory rate 28, saturation 97% on 2 liters.  GENERAL:  She is morbidly obese, alert and oriented, in mild distress.  HEENT:  PERRLA, EOMI.  NECK:  Supple, no JVD, no lymphadenopathy.  RESPIRATORY:  She has good air entry bilaterally, no wheezes or rales.  CARDIOVASCULAR SYSTEM:  She has S1 and S2, no audible murmur.  ABDOMEN:  Obese, soft, with epigastric tenderness but no rebound tenderness.  EXTREMITIES:  No cyanosis, clubbing, and edema.   LABORATORY DATA:  White count  8.2, high, with a left shift, ANC 7.8,  hemoglobin 12.5, platelet count 287.  Cardiac enzymes initially were all  negative.  PT INR 23.6 and 2.1.  Sodium 142, potassium 3.8, chloride 106,  CO2 30, glucose 101, BUN 26, creatinine 1.4, calcium 8.3, total protein 5.6,  albumin 3.2.  AST 641, ALT 344, alkaline phos 89, total bilirubin 2.  Her  lipase is more than 2000.  Right upper quadrant abdominal ultrasound was  inconclusive secondary to body habitus.   ASSESSMENT:  This is an 77 year old female who presented with acute  pancreatitis.  The most likely cause is gallstones, although ultrasound has  not been helpful.  By ultrasound, there is some gallbladder thickness.  The  patient also has increased LFTs including hyperbilirubinemia which may  indicate some form of obstruction.  With no alcohol history, the patient is  likely to have mainly either drug related or gallstone related.  She is on  multiple medications, some of which can actually cause pancreatitis,  however, these are not new  medications.  The patient may also have  hypertriglyceridemia, but she is already on Vytorin for dyslipidemia and no  history of isolated hypertriglyceridemia.  Other possible causes of her  pancreatitis could be pancreatic tumor.   PLAN:  1.  Pancreatitis.  Will admit the patient to the stepdown unit due to high      lipase.  Will hydrate her and keep her n.p.o. and follow her lipase and      liver function tests closely.  Pain control will be the main issue.      Once her pain and lab results seem to have returned to normal, we will      start her back on diet and advance the diet accordingly.  2.  Hypertension.  The patient's blood pressure seems to be controlled.  We      will keep her on p.r.n. Labetalol to keep her blood pressure down until      she can start taking oral medications.  3.  Morbid obesity.  The patient is fully aware, and has been counseled now      and before.  4.  Coronary artery disease.  She seems to be doing OK right now with no      chest pain, however, will go ahead and check serial cardiac enzymes to      think out of the box.  5.  History of PE.  The patient has been on Coumadin and her INR is      therapeutic.  We will  keep her on Coumadin IV until she can start      taking orally.   Other medical problems seem to be stable including COPD and we will put her  on p.r.n. Albuterol and Atrovent p.r.n. as indicated.      Lonia Blood, M.D.  Electronically Signed     LG/MEDQ  D:  12/12/2005  T:  12/12/2005  Job:  [id number]   cc:   Kirk Ruths, M.D.  Fax: [phone]

## 2011-02-15 NOTE — Op Note (Signed)
NAME:  [name] [name], [name] [name]                  ACCOUNT NO.:  [id number]   MEDICAL RECORD NO.:  [id number]          PATIENT TYPE:  OIB   LOCATION:  1605                         FACILITY:  [facility]   PHYSICIAN:  Thomas A. Cornett, M.D.DATE OF BIRTH:  [birth year]   DATE OF PROCEDURE:  03/31/2006  DATE OF DISCHARGE:                                 OPERATIVE REPORT   PREOPERATIVE DIAGNOSIS:  Chronic cholecystitis with a longstanding  cholecystostomy tube.   POSTOPERATIVE DIAGNOSIS:  Chronic cholecystitis with a longstanding  cholecystostomy tube.   PROCEDURE:  Laparoscopic cholecystectomy with cholangiogram.   SURGEON:  Thomas A. Cornett, MD.   ANESTHESIA:  General endotracheal anesthesia, 10 ml of 0.25% Sensorcaine was  also used, approximately 15 ml.   ESTIMATED BLOOD LOSS:  30 ml.   ASSISTANT:  Adolph Pollack, MD.   DRAINS:  A 19-French Blake drain was placed in the gallbladder fossa.   INDICATIONS FOR PROCEDURE:  The patient is an 77 year old female, who was  seen about three months ago due to gallstone pancreatitis.  She had numerous  comorbidities including history of pulmonary embolus, and her INR was too  high to have surgery at that time.  She underwent a percutaneous  cholecystoscopy tube once her INR was corrected and her pancreatitis  improved.  She has recovered from all of that but is in need of a  cholecystectomy at this point.  She has numerous vertebral fractures that  are pathologic and will have to have these repaired, and she is going to get  her gallbladder removed first prior to doing that.   I discussed with the patient the operative procedure, the potential risk,  the long term outcomes, and other possible modalities of therapy depending  on what is found.  She understood and agreed to proceed.   DESCRIPTION OF PROCEDURE:  The patient was brought to the operating room and  placed supine.  After induction of general endotracheal anesthesia, the  abdomen was prepped  and draped in a sterile fashion.  A 1 cm supraumbilical  incision was made and dissection was carried down to the fascia.  A small  incision was made in the fascia, two Kochers were used to grasp both sides  of the fascia and pull it up.  There was a small umbilical defect, I was  able to enter the abdominal cavity through that and make this a little bit  bigger as part of the incision in the fascia.  Once I did this, I placed my  finger in the preperitoneal space and pushed through.  A pursestring suture  of #0 Vicryl was placed, and a 12 mm Hassan cannula was placed under direct  vision.  Pneumoperitoneum was created with 15 mmHg of CO2 and a  laparoscope was placed.  The patient was then placed in reverse  Trendelenburg and rolled to her left.  The cholecystoscopy tube was then  identified and was cut on the outside of the patient.  The pigtail portion  remained in the gallbladder, we used it for traction to grab the dome of the  gallbladder with the pigtail catheter.  We were able to grab the dome and  then retract it toward the patient and spread apart after separating the  gallbladder from the gallbladder wall.  Next, a second grasper was used to  grasp the body of the gallbladder.  There were still some inflammatory  changes noted.  We were able to score the peritoneum and pull this away from  the gallbladder.  The anatomy was somewhat distorted due to the percutaneous  cholecystoscopy tube.  We were able to follow the gallbladder down toward  where it became smaller at the junction of the infundibulum and cystic duct,  then able to dissect around this structure.  Once I had done this, I placed  a clip on the gallbladder side of this.  A small incision was made in the  cystic duct.  Bile came back through.  A Cook catheter was then introduced  through a separate stab incision from the cholangiogram.  We placed this in  the gallbladder but pushed it to the posterior wall of the  gallbladder and  we withdrew it.  I then used a Reddick catheter.  I was able to place it in  the cystic duct a lot easier and then blow the balloon up and secure it with  the balloon.  There was no leakage from the catheter.  Intraoperative  cholangiogram was performed and there was no evidence of any extravasation.  There was free flow of contrast down the common duct into the duodenum.  The  common duct was dilated to about 1.5 cm.  There was free flow of contrast up  to the bifurcation of the right and left hepatic ducts.  The right hepatic  duct was somewhat aberrant and came off when anteriorly forced and then went  to the right.  The left ducts went to the left.  No evidence of stone or  stricture.  At this point, I removed the Reddick catheter, I triple clipped  the cystic duct stump and then divided it up by the gallbladder.  The cystic  artery was identified and the branches were controlled individually on the  gallbladder with small clips and divided.  We were very careful to stay on  the gallbladder due to the distortion of the anatomy.  We did dissect the  gallbladder from the gallbladder fossa and then removed it from the  gallbladder bed with good hemostasis.  With an EndoCatch bag, the  gallbladder with the previously placed pigtail catheter were then placed in  the bag and extracted under direct vision.  The catheter and gallbladder  were passed off of the field.  At this point in time, I inspected the  gallbladder bed and found the clips to be on the cystic duct and cystic  arteries respectively.  Irrigation was used and these were suctioned out.  Surgicel was then placed due to some oozing and the patient having a history  of using Coumadin for a chronic PE.  Hemostasis was excellent.  I elected to  place a 19-French drain in the gallbladder fossa since there was some trauma to the posterior wall of the cystic duct.  I felt that I had clipped this  well, but I could not  be exactly sure where the cystic duct was pushed  through.  I did not see any leakage of bile or extravasation of bile to  indicate a continued leak or injury to the common duct or cystic duct from  the catheterization procedure.  This was placed under direct vision in the  gallbladder bed.  This was placed to bulb suction.  At this point in time, I  suctioned out all excess irrigation that we used until clear.  There was no  evidence of any solid or hollow organ injury during this portion of the  case.  Our ports were then extracted with the drain being pulled out the  most lateral right lower quadrant port.  This was secured to the skin with a  #3-0 nylon.  All ports were removed, and the CO2 was released.  I closed the  umbilical port site with the previously placed #0 Vicryl.  #4-0 Monocryl was  then used to close the skin of all four incisions.  Sterile dressings were  applied.  All final sponge, needle, and instrument counts were found to be  correct at this portion of the case.  The patient was then awoke and taken  to recovery in satisfactory condition.      Thomas A. Cornett, M.D.  Electronically Signed     TAC/MEDQ  D:  03/31/2006  T:  03/31/2006  Job:  [id number]   cc:   Thereasa Solo. Little, M.D.  Fax: [phone]   Lucky Cowboy, M.D.  Fax: [phone]

## 2011-02-15 NOTE — Discharge Summary (Signed)
[name] [name], [name] [name]                  ACCOUNT NO.:  [id number]   MEDICAL RECORD NO.:  [id number]          PATIENT TYPE:  INP   LOCATION:  4731                         FACILITY:  [facility]   PHYSICIAN:  Thereasa Solo. Little, M.D. DATE OF BIRTH:  02-Oct-1922   DATE OF ADMISSION:  DATE OF DISCHARGE:  08/23/2004                                 DISCHARGE SUMMARY   ADMISSION DIAGNOSES:  1.  Chest pain, rule out unstable angina.  2.  Coronary artery disease, status post LAD stent 1996, status post cutting      balloon angioplasty to the second OM August 2004.  3.  Hypertension.  4.  Dyslipidemia.  5.  History of supraventricular tachycardia.  6.  COPD.   DISCHARGE DIAGNOSES:  1.  Chest pain, rule out unstable angina.  2.  Coronary artery disease, status post left anterior descending artery      stent 1996, status post cutting balloon angioplasty to the second obtuse      marginal. August 2004.  3.  Hypertension.  4.  Dyslipidemia.  5.  History of supraventricular tachycardia.  6.  Chronic obstructive pulmonary disease.   PROCEDURE:  Cardiac catheterization, percutaneous coronary intervention and  a new Taxus stent placed to the mid left anterior descending artery, 75% to  0%.   BRIEF HISTORY:  The patient is an elderly, 77 year old white female, medical  patient of Dr. Mindi Junker Little's with a long history of coronary disease. She  presented to the emergency department the night of admission with chest pain  which began after dinner.  Chest pain was described as pressure,  indigestion, discomfort located in her substernal region, did not radiate.   PAST MEDICAL HISTORY:  Congestive heart failure, supraventricular  tachycardia, hypertension, dyslipidemia.   MEDICATIONS ON ADMISSION:  Lipitor, Plavix and an unknown diuretic.   HISTORY AND PHYSICAL:  For further history and physical, please see dictated  note.   HOSPITAL COURSE:  The patient was admitted, placed on medical management, IV  heparin, nitroglycerin, aspirin, Plavix was continued, serial enzymes and  telemetry were initiated.  The patient underwent cardiac catheterization  later that day by Dr. Chanda Busing. This showed the right coronary artery  was normal.  The left main was normal, the left circumflex was normal and  there was a 60% stenosis at the proximal portion of the first OM. The LAD  had a Cook stent in the proximal portion which was normal. There was a  distal 75% LAD stenosis after the second diagonal.   The patient underwent PCI and Taxus stenting for this.  She tolerated the  procedure well and returned to the floor and telemetry unit in satisfactory  condition.  The following a.m., hemoglobin was 11.3, hematocrit 33, white  count 8.3, platelets 281,000.  Electrolytes normal, BUN 8, creatinine 0.9.  EKG normal.  Overall, the patient was doing well. She had some vague UTI  symptoms, and a UA and urine culture were obtained and these were to be  followed on an outpatient basis the following week, and she was started  empirically on Septra-DS  1 q.12h.  Subsequently, she was discharged home on:   DISCHARGE MEDICATIONS:  1.  Aspirin 81 mg daily.  2.  Plavix 75 mg daily.  3.  Protonix 40 mg daily.  4.  Zocor 20 mg daily.  5.  Cardizem 120 mg or 180 (we were unsure what her dose was, and she was      instructed to continue her home dose).  6.  Septra-DS 1 q.12h. for UTI.  7.  Nitroglycerin 1:150 SL p.r.n. for chest pain.   DISCHARGE INSTRUCTIONS:  The patient was to have UA and urine culture prior  to discharge and this was to be rechecked the following week.  Light  activity. She was given a dressing over her groin until site has healed. She  was to maintain a diabetic, low fat diet.  Keep her site clean with plain  soap and water. Dr. Fredirick Maudlin office was to schedule her back in 2-3 weeks.       WDJ/MEDQ  D:  10/09/2004  T:  10/09/2004  Job:  [id number]

## 2011-02-15 NOTE — Cardiovascular Report (Signed)
NAME:  ENID, MAULTSBY                            ACCOUNT NO.:  [id number]   MEDICAL RECORD NO.:  [id number]                   PATIENT TYPE:  INP   LOCATION:  6525                                 FACILITY:  [facility]   PHYSICIAN:  Thereasa Solo. Little, M.D.              DATE OF BIRTH:  [birth year]   DATE OF PROCEDURE:  05/03/2003  DATE OF DISCHARGE:                              CARDIAC CATHETERIZATION   INDICATIONS:  Ms. Zody is a 77 year old female who had a stent placed to  her LAD in 1996.  She presented on April 29, 2003, to the emergency room with  new onset palpitations, which were supraventricular, arrhythmias.  I could  not tell whether it was atrial fibrillation or multifocal atrial  tachycardia.  It has been intermittent, but she complains of marked dyspnea  on exertion with minimal activity that is substantially worse in the last  month.  Because of this, she is brought to the catheterization lab for  cardiac catheterization.   DESCRIPTION OF PROCEDURE:  The patient was prepped and draped in the usual  sterile fashion, exposing the right groin.  Following local anesthetic of 1%  xylocaine, the Seldinger technique was employed and a 6-French introducer  sheath was placed into the right femoral artery.  Left and right coronary  arteriography and ventriculography were performed.   HEMODYNAMIC MONITORING:  1. Central aortic pressure was 122/60.  2. Left ventricle pressure was 122/7 with no aortic valve gradient noted at     the time of pullback.   VENTRICULOGRAPHY:  Ventriculography in the RAO projection revealed mitral  valve prolapse with no mitral regurgitation.  Left ventricular end diastolic  pressured was elevated at 32.  The ejection fraction was 50% and there was  no focal wall motion abnormality.   CORONARY ARTERIOGRAPHY:  On fluoroscopy, there was calcification seen in the  LAD and a stent in the LAD.  1. Left main: Normal.  2. LAD: The LAD extended all the way down and  crossed the apex of the heart.     It gave rise to two small diagonal branches.  There was no significant     disease in the LAD system.  The stent in the proximal portion was widely     patent.  3. Circumflex: The circumflex basically gave rise to two large OM vessels.     These OM vessels bifurcated.  In the ostium of the second OM at the     bifurcation was a focal area of 80-90% narrowing.  There was TIMI-3 flow.     The distal vessel was free of disease and OM-1 was not involved.  4. Right coronary artery was a normal, dominant vessel.   Because of the high grade stenosis in the ostium of OM-2, arrangements were  made for percutaneous intervention.   The 5-French sheath was exchanged out for a 6-French sheath.  A  6-French [id number]  guide catheter and an 182 cm short Luge wire was used.  The wire was placed  into the distal portion of OM-2.  A 2.5 x 15 mm cutting balloon was placed  in such a manner that the proximal and distal ends of obstruction were well  covered.  A total of five inflations were performed ranging from 8-10  atmospheres and all for around 60 seconds.  At the termination of the  procedure, there was still an area of 60% narrowing in the ostium.  There  was some narrowing in the OM-2 ostium, which had not been present but when  the balloon was pulled back out of the vessel, this resolved and I suspect  this was spasm.  She was actually given intracoronary nitroglycerin but  there was no significant change in this appearance until the balloon  catheter was removed.   Because of the continued narrowing, a 2.75 x 15 mm cutting balloon was  placed in position and three inflations of 10 atmospheres for 61 seconds x 3  were performed.  This area now was less than 25% narrowed.  There was brisk  TIMI-3 flow.  The ostium of the OM-1 was not involved.   The patient was given Angiomax during the procedure and the sheath should be  able to be removed later today.   It was  interesting that with the deflation of the balloon each time, her  supraventricular arrhythmia resolved but at the time she left the  catheterization lab, she was still having episodes of this arrhythmia and I  will ask EP to see her.                                               Thereasa Solo. Little, M.D.    ABL/MEDQ  D:  05/03/2003  T:  05/03/2003  Job:  [id number]   cc:   Artis Delay, M.D.

## 2011-02-15 NOTE — Consult Note (Signed)
NAME:  [name] [name], [name] [name]                  ACCOUNT NO.:  [id number]   MEDICAL RECORD NO.:  [id number]          PATIENT TYPE:  INP   LOCATION:  3308                         FACILITY:  [facility]   PHYSICIAN:  Revonda Standard L. Rennis Harding, N.P. DATE OF BIRTH:  18-Nov-1922   DATE OF CONSULTATION:  12/13/2005  DATE OF DISCHARGE:                                   CONSULTATION   REQUESTING PHYSICIAN:  Elliot Cousin, M.D.   PRIMARY CARE PHYSICIAN:  Lucky Cowboy, M.D.   CARDIOLOGIST:  Thereasa Solo. Little, M.D.   REASON FOR CONSULTATION:  Acute cholecystitis with biliary pancreatitis.   HISTORY OF THE PRESENT ILLNESS:  [name] [name] is an 77 year old female patient  with a known history of CAD with stents and PCI.  Her last catheterization  was in January 2007 and at that time the stents were revealed to be patent.  She also has a new diagnosis of bilateral PE secondary to isolated DVT.  She  is now on Coumadin and her INR is currently therapeutic.  She was admitted  on December 12, 2005 after developing the acute onset of severe abdominal pain  with associated nausea and vomiting that morning.  Her symptoms have been  unrelenting despite the use of pain medications and NPO status.  Her pain is  located in the right shoulder, right upper quadrant and left upper quadrant.  She came to the ER for evaluation yesterday. At that time her lipase was  greater than 2,000.  An ultrasound was unrevealing, but a CT of the abdomen  and pelvis showed biliary duct dilatation, mild pancreatitis, and an  extensive inflammatory process involving the right upper quadrant with  possible abscess and gangrenous gallbladder.  Her white count on  presentation was 8,200 and this morning is up to 24,400.  Her initial LFTs  were elevated with a total bilirubin of 2.1.  The patient is currently  afebrile.  Her blood pressure has varied systolically from the 120s and now  is down to the one-teens.  She is complaining of severe pain in the right  shoulder, right upper quadrant and left upper quadrant  with the sensation  of nausea and reflux.   The husband tells me that about two weeks ago the patient presented to the  ER with similar symptoms, but much less severe and duration of only about  six to seven hours.  These symptoms were apparently self-limiting.  Because  of her cardiac history she was worked up as a possible cardiac cause of this  pain; and, apparently that workup was negative, and the patient was  discharged to home from the ER.   REVIEW OF SYSTEMS:  The review of systems is per the history of present  illness.  There are no recently problem related to chest pain, shortness of  breath, or tachy palpitations. GASTROINTESTINAL:  The GI symptoms are as  above.  GENITOURINARY:  No dysuria.  No dark or bloody stools.  MUSCULOSKELETAL:  No lower extremity swelling or leg pain.   FAMILY HISTORY:  The family medical history is noncontributory.   SOCIAL  HISTORY:  The patient is married.  Her husband is at the bedside.  She does not utilize tobacco or alcohol products.   PAST MEDICAL HISTORY:  1.  CAD with prior stent to the LAD, patent per catheterization in January      2007.  2.  Normal LVEF of 50-55% per cath in January 2007.  3.  Hypertension.  4.  Left __________  deep vein thrombosis in January 2007.  5.  Bilateral pulmonary embolus currently on Coumadin anticoagulation      diagnosed in January 2007.  6.  Dyslipidemia.  7.  COPD.  8.  Obesity.  9.  Paroxysmal supraventricular tachycardia.   PAST SURGICAL HISTORY:  Hysterectomy.   ALLERGIES:  No known drug allergies.   CURRENT MEDICATIONS:  1.  Zosyn IV and Flagyl IV started today by Dr. Sherrie Mustache.  2.  Coumadin is on hold.  3.  The patient is to receive one dose of vitamin K subcu now.  4.  The patient's Lopressor has been changed to IV.  5.  The patient is on p.r.n. Zofran.  6.  The patient is on Protonix.  7.  The patient has been on p.r.n.  Dilaudid.   PHYSICAL EXAMINATION:  GENERAL APPEARANCE:  In general this is an elderly  female who is sedated and resting.  When she was aroused for the exam she  became quite anxious secondary to pain; complaining of severe pain.  VITAL SIGNS:  Temp 98.1, BP 110/45, pulse 72 and regular, and respirations  20.  NEUROLOGIC EXAMINATION:  The patient is alert and oriented times three.  She  is somewhat groggy from the sedation and the pain medications.  She moves  all extremities times four.  No obvious focal neurological deficits.  HEENT:  The head is normocephalic.  The sclerae is not injected,  NECK:  The neck is supple without adenopathy.  CHEST:  Bilateral lung sounds are clear to auscultation.  The patient is  sating 95% on 4 liters nasal cannula O2.  HEART:  Cardiac reveals S1 and S2.  No obvious rubs, murmurs, thrills or  gallops.  No JVD.  ABDOMEN:  The abdomen is soft.  Bowel sounds are not present.  She is  exquisitely tender in the right upper quadrant and less in the left upper  quadrant.  PELVIC:  Genitourinary - the patient has been incontinent of urine and the  nursing staff is inserting a Foley catheter at the present time.  EXTREMITIES:  The extremities show __________  edema bilaterally in the  lower extremities.  Pulse are palpable.   LABORATORY DATA:  Sodium 141, potassium 4.6, CO2 25, BUN 25, creatinine 1.4.  AST 416 down from 641, ALT 407 up from 344.  Total bilirubin 1.6 down from  2.1 and alkaline phosphatase 81.  White count 24,400 today, hemoglobin 11.2,  platelets 250,000.  Lipase 1,129 down from greater than 2,000.  PT is 22.8  today and INR is 2.0.   Diagnostic studies:  CT of the abdomen and pelvis have been done and is  described in the history of present illness.  EKG shows sinus rhythm without  acute ischemic changes.  Portable chest x-ray was done on admission, which showed a persistently elevated right hemidiaphragm, otherwise no chronic  issues and  no acute issues.   IMPRESSION:  1.  Acute severe inflammatory cholecystitis.  2.  Transaminitis.  3.  Biliary pancreatitis.  4.  History of recent bilateral pulmonary embolism on Coumadin, fully  anticoagulated.  5.  Severe abdominal pain secondary to acute cholecystitis and pancreatitis.  6.  Leukocytosis secondary to acute cholecystitis, early systemic      inflammatory response syndrome.  7.  Known coronary artery disease with left anterior descending stent,      patent per catheterization in January 2007.  8.  Hypertension with preserved left ventricular function.   PLAN:  1.  Agree with reversing anticoagulation.  The patient will need IV heparin      once INR is less than 2.0 because of recent bilateral PE.  2.  Encouraging with bowel rest and IV fluids that her lipase and LFTs are      already decreasing.  More concerning though is the continued severe      pain, elevated white count and fluid collection versus abscess on CT.  3.  Agree with starting Zosyn and Flagyl, and repeat labs in the morning.  4.  Normally with pancreatitis we allow for a cool down phase.  We will      discuss this with Dr. Luisa Hart, but with the degree of pain and      leukocytosis this may be an indication to go ahead and proceed with      acute cholecystitis.  At this point it would be difficult because the      INR right now is therapeutic.  We will continue to monitor this      situation.  5.  If we day of discharge not proceed immediately with patient may need GI      evaluation for possible ERCP before proceeding.  6.  Agree repeating amylase, lipase and CMET in the morning.  7.  Cardiac clearance is pending per Dr. Clarene Duke; still as noted awaiting INR      to decrease to around 1.5-1.6 before can safely proceed with      cholecystectomy.  Agree with vitamin K and holding Coumadin.  8.  Continue bowel rest and IV fluids.  The patient had normal LV function      and blood pressure is beginning  to trend down.  Would increase fluids to      150/hour.  Continue potassium supplementation.  9.  We will also attempt to maximize pain control without oversedating the      patient.  We will change to low-dose PCA Dilaudid.  10. The patient had not slept since the night before and is exhausted, but      has had difficulty sleeping due to pain.  The patient's creatinine is      1.4, so cannot give regularly scheduled Toradol.  We will give one dose      IV Toradol today to see if this will help to get the patient's pain      under better control for the time being.  11. Agree with checking blood culture remaining in the critical care      setting; concern for bacteremia secondary to ascending cholangitis.  The      patient certainly meets criteria at this point for early systemic      inflammatory response syndrome; continue to monitor closely.      Allison L. Rennis Harding, N.P.    ALE/MEDQ  D:  12/13/2005  T:  12/15/2005  Job:  [id number]   cc:   Thereasa Solo. Little, M.D.  Fax: [phone]   Lucky Cowboy, M.D.  Fax: [phone]

## 2011-02-15 NOTE — Cardiovascular Report (Signed)
NAMEANUSHKA, HARTINGER                  ACCOUNT NO.:  [id number]   MEDICAL RECORD NO.:  [id number]          PATIENT TYPE:  OBV   LOCATION:  6533                         FACILITY:  [facility]   PHYSICIAN:  Madaline Savage, M.D.DATE OF BIRTH:  08/09/1923   DATE OF PROCEDURE:  08/22/2004  DATE OF DISCHARGE:                              CARDIAC CATHETERIZATION   PROCEDURES PERFORMED:  1.  Selective coronary angiography by Judkins technique.  2.  Retrograde left heart catheterization.  3.  Left ventricular angiography.  4.  Intravascular ultrasound of the left anterior descending coronary      artery.  5.  Percutaneous coronary stenting of the mid left anterior descending with      a direct stent approach.   COMPLICATIONS:  None.   ENTRY SITE:  Right femoral.   DYE USED:  Omnipaque.   CATHETERS USED:  6 [place] diagnostic and guide catheters.   PATIENT PROFILE:  The patient is a 77 year old white female patient of Dr.  Caprice Kluver who presents with chest pain which follows a previous cutting  balloon angioplasty of the obtuse marginal #2 in August 2004.  She has  dyslipidemia, hypertension,  history of supraventricular tachycardia and  chronic obstructive lung disease.  The patient's cardiac enzymes have been  negative and she is now pain free on nitroglycerin and was brought to the  cath lab for further study.   RESULTS:   PRESSURES:  Left ventricular pressure was 150/11, end-diastolic pressure 31.  Central aortic pressure 150/65, mean of 105.  No aortic valve gradient by  pullback technique.   ANGIOGRAPHIC RESULTS:  1.  The left main coronary artery was patent.  No lesions were seen.  There      was faint calcification in this vessel.  2.  The LAD coronary artery courses to the cardiac apex and gives rise to      two septal perforator branches fairly near the origin of the vessel.  It      gives rise to a medium size first diagonal and a much small second      diagonal branch.  Both  diagonal branches show trivial ostial      irregularities and no significant stenosis in either vessel.  The LAD      contains a proximally placed radioopaque stent dating back to 1996 which      is known to be a Cook stent which is patent throughout.  There is a      focal short eccentric stenosis in the mid LAD just beyond the origin of      the second diagonal branch which is 75-85% severe.  It is seen only in      one view, however, and intravascular ultrasound was required to further      delineate this.  3.  The left circumflex coronary artery gives rise to an obtuse marginal      branch and just after the obtuse marginal branch arises the circumflex      itself.  Contains an ostial stenosis of 60% which is the site of an  old      cutting balloon angioplasty from August 2005.  There is TIMI-3 distal      flow.  4.  The right coronary artery is widely patent throughout and is a dominant      vessel giving rise to both a posterior descending and a posterior      lateral branch.  No lesions were seen in any portions of the RCA.   The left ventricular angiogram showed good contractility of the left  ventricle.  There was a 50-60% ejection fraction with no mitral  regurgitation.   The intravascular ultrasound was completed with an Atlantis catheter after  2500 units of heparin were given and an ACT documented to be 240.  With the  IVUS catheter, it was noted that the proximal Cook stent was widely patent  throughout with no significant end-stent restenosis.  The reference vessel  in the LAD distal to the stenotic lesion in the mid portion of the vessel  was 2.8 x 2.9.  The stenotic area was greater than 75% stenosed and 1.3 x  1.4 mm in diameter at best.  The percutaneous intervention was performed  with a 6 French 3.5 guide catheter without side holes.  The guide wire was a  Luge wire.  There was predilatation with a balloon.  The stent was deployed  easily without any resistance to  crossing this Cook stent and no resistance  to crossing the lesion with a 2.75 x 8-mm Taxus stent.  It was deployed to  16-17 atmospheres on two occasions corresponding to an anticipated lumen  diameter of 3.1 theoretically, and we actually measured a lumen diameter  post percutaneous intervention of 3.1 x 3.1.  There was preservation of TIM-  3 flow.   FINAL DIAGNOSES:  1.  Unstable angina.  2.  Status post left anterior descending stenting in 1996 and cutting      balloon angioplasty obtuse marginal branch August 2005.  3.  Normal left ventricular systolic function.  4.  75-85% stenosis of the mid left anterior descending.  5.  60% residual stenosis in obtuse marginal branch of circumflex status      post cutting balloon angioplasty.  6.  Successful direct stenting of the mid left anterior descending reduction      of 75-85% lesion to 0% residual.   Angiomax was used during the case.  The patient was previously on Plavix and  that will be continued.  Angiomax will be discontinued shortly and sheath  pulled in two hours.       WHG/MEDQ  D:  08/22/2004  T:  08/22/2004  Job:  [id number]

## 2011-04-19 ENCOUNTER — Emergency Department (HOSPITAL_COMMUNITY): Payer: Medicare Other

## 2011-04-19 ENCOUNTER — Emergency Department (HOSPITAL_COMMUNITY)
Admission: EM | Admit: 2011-04-19 | Discharge: 2011-04-19 | Disposition: A | Discharge status: Home / Self Care | Payer: Medicare Other | Attending: Emergency Medicine | Admitting: Emergency Medicine

## 2011-04-19 DIAGNOSIS — Z86711 Personal history of pulmonary embolism: Secondary | ICD-10-CM | POA: Insufficient documentation

## 2011-04-19 DIAGNOSIS — R1013 Epigastric pain: Secondary | ICD-10-CM | POA: Insufficient documentation

## 2011-04-19 DIAGNOSIS — Z79899 Other long term (current) drug therapy: Secondary | ICD-10-CM | POA: Insufficient documentation

## 2011-04-19 DIAGNOSIS — R079 Chest pain, unspecified: Secondary | ICD-10-CM | POA: Insufficient documentation

## 2011-04-19 DIAGNOSIS — E669 Obesity, unspecified: Secondary | ICD-10-CM | POA: Insufficient documentation

## 2011-04-19 DIAGNOSIS — Z7901 Long term (current) use of anticoagulants: Secondary | ICD-10-CM | POA: Insufficient documentation

## 2011-04-19 DIAGNOSIS — I252 Old myocardial infarction: Secondary | ICD-10-CM | POA: Insufficient documentation

## 2011-04-19 LAB — CBC
HCT: 45.9 % (ref 36.0–46.0)
Hemoglobin: 15.8 g/dL — ABNORMAL HIGH (ref 12.0–15.0)
MCH: 29.6 pg (ref 26.0–34.0)
MCHC: 34.4 g/dL (ref 30.0–36.0)

## 2011-04-19 LAB — DIFFERENTIAL
Lymphocytes Relative: 26 % (ref 12–46)
Monocytes Absolute: 1.1 10*3/uL — ABNORMAL HIGH (ref 0.1–1.0)
Monocytes Relative: 13 % — ABNORMAL HIGH (ref 3–12)
Neutro Abs: 5.1 10*3/uL (ref 1.7–7.7)

## 2011-04-19 LAB — POCT I-STAT, CHEM 8
BUN: 15 mg/dL (ref 6–23)
Chloride: 100 mEq/L (ref 96–112)
Creatinine, Ser: 0.8 mg/dL (ref 0.50–1.10)
Glucose, Bld: 115 mg/dL — ABNORMAL HIGH (ref 70–99)
Potassium: 3.8 mEq/L (ref 3.5–5.1)

## 2011-04-19 LAB — TROPONIN I: Troponin I: <0.3 ng/mL (ref <0.30)

## 2011-04-19 LAB — CK TOTAL AND CKMB (NOT AT ARMC): Relative Index: INVALID (ref 0.0–2.5)

## 2011-06-08 ENCOUNTER — Other Ambulatory Visit: Payer: Self-pay | Admitting: Internal Medicine

## 2011-06-25 LAB — HEPATIC FUNCTION PANEL
AST: 116 — ABNORMAL HIGH
Alkaline Phosphatase: 68
Bilirubin, Direct: 0.2
Bilirubin, Direct: 0.2
Indirect Bilirubin: 0.5
Indirect Bilirubin: 0.6
Total Bilirubin: 0.8
Total Protein: 5.3 — ABNORMAL LOW

## 2011-06-25 LAB — POCT I-STAT, CHEM 8
BUN: 17
Calcium, Ion: 1.11 — ABNORMAL LOW
Glucose, Bld: 143 — ABNORMAL HIGH
Hemoglobin: 12.9
Sodium: 140
TCO2: 28

## 2011-06-25 LAB — CBC
HCT: 28 — ABNORMAL LOW
Hemoglobin: 9.2 — ABNORMAL LOW
MCHC: 32.8
MCHC: 33.7
MCHC: 33.2
MCV: 73.7 — ABNORMAL LOW
MCV: 74 — ABNORMAL LOW
MCV: 74.2 — ABNORMAL LOW
Platelets: 244
Platelets: 266
Platelets: 229
RBC: 3.79 — ABNORMAL LOW
RBC: 3.83 — ABNORMAL LOW
RBC: 4.33
RBC: 3.97
RDW: 18.2 — ABNORMAL HIGH
RDW: 18.4 — ABNORMAL HIGH
RDW: 18.9 — ABNORMAL HIGH
WBC: 22.5 — ABNORMAL HIGH
WBC: 9.7

## 2011-06-25 LAB — DIFFERENTIAL
Basophils Absolute: 0
Eosinophils Absolute: 0
Eosinophils Relative: 0
Lymphocytes Relative: 2 — ABNORMAL LOW
Lymphocytes Relative: 6 — ABNORMAL LOW
Lymphs Abs: 0.4 — ABNORMAL LOW
Lymphs Abs: 0.6 — ABNORMAL LOW
Monocytes Absolute: 0.6
Neutro Abs: 9 — ABNORMAL HIGH
Neutrophils Relative %: 93 — ABNORMAL HIGH

## 2011-06-25 LAB — COMPREHENSIVE METABOLIC PANEL
ALT: 184 — ABNORMAL HIGH
ALT: 804 — ABNORMAL HIGH
AST: 1108 — ABNORMAL HIGH
AST: 397 — ABNORMAL HIGH
AST: 720 — ABNORMAL HIGH
Albumin: 3.1 — ABNORMAL LOW
Alkaline Phosphatase: 70
BUN: 15
CO2: 27
CO2: 28
CO2: 31
Calcium: 8.5
Calcium: 9.1
Chloride: 101
Chloride: 101
Chloride: 102
Creatinine, Ser: 0.77
Creatinine, Ser: 0.79
Creatinine, Ser: 1.01
GFR calc Af Amer: >60
GFR calc Af Amer: >60
GFR calc Af Amer: >60
GFR calc non Af Amer: >60
GFR calc non Af Amer: >60
GFR calc non Af Amer: >60
Glucose, Bld: 106 — ABNORMAL HIGH
Glucose, Bld: 150 — ABNORMAL HIGH
Glucose, Bld: 80
Potassium: 3.4 — ABNORMAL LOW
Sodium: 137
Sodium: 139
Total Bilirubin: 1.5 — ABNORMAL HIGH
Total Bilirubin: 1.5 — ABNORMAL HIGH

## 2011-06-25 LAB — PROTIME-INR
INR: 1.6 — ABNORMAL HIGH
INR: 2.7 — ABNORMAL HIGH
INR: 3.4 — ABNORMAL HIGH
INR: 3.8 — ABNORMAL HIGH
Prothrombin Time: 16.1 — ABNORMAL HIGH
Prothrombin Time: 19.5 — ABNORMAL HIGH
Prothrombin Time: 35.8 — ABNORMAL HIGH
Prothrombin Time: 15

## 2011-06-25 LAB — BASIC METABOLIC PANEL
CO2: 28
Chloride: 103
Creatinine, Ser: 0.64
GFR calc Af Amer: >60

## 2011-06-25 LAB — URINALYSIS, ROUTINE W REFLEX MICROSCOPIC
Glucose, UA: NEGATIVE
Ketones, ur: 15 — AB
pH: 6

## 2011-06-25 LAB — BILIRUBIN, DIRECT: Bilirubin, Direct: 0.6 — ABNORMAL HIGH

## 2011-06-25 LAB — IRON AND TIBC
Saturation Ratios: 8 — ABNORMAL LOW
UIBC: 209

## 2011-06-25 LAB — RETICULOCYTES
RBC.: 4.16
Retic Count, Absolute: 37.4

## 2011-06-25 LAB — LIPASE, BLOOD: Lipase: 33

## 2011-06-25 LAB — FOLATE: Folate: >20

## 2011-06-25 LAB — URINE MICROSCOPIC-ADD ON

## 2011-06-25 LAB — FERRITIN: Ferritin: 24 (ref 10–291)

## 2011-06-25 LAB — CK TOTAL AND CKMB (NOT AT ARMC): Relative Index: INVALID

## 2011-10-14 ENCOUNTER — Other Ambulatory Visit: Payer: Self-pay | Admitting: Internal Medicine

## 2012-02-10 ENCOUNTER — Other Ambulatory Visit: Payer: Self-pay | Admitting: Internal Medicine

## 2012-03-10 ENCOUNTER — Other Ambulatory Visit: Payer: Self-pay | Admitting: Internal Medicine

## 2012-03-12 ENCOUNTER — Other Ambulatory Visit: Payer: Self-pay

## 2012-04-07 ENCOUNTER — Encounter: Payer: Self-pay | Admitting: Internal Medicine

## 2012-04-07 ENCOUNTER — Ambulatory Visit (INDEPENDENT_AMBULATORY_CARE_PROVIDER_SITE_OTHER): Payer: Medicare Other | Admitting: Internal Medicine

## 2012-04-07 VITALS — BP 118/68 | HR 72 | Ht 61.0 in | Wt 199.1 lb

## 2012-04-07 DIAGNOSIS — K219 Gastro-esophageal reflux disease without esophagitis: Secondary | ICD-10-CM

## 2012-04-07 MED ORDER — FAMOTIDINE 40 MG PO TABS: 40.0000 mg | ORAL_TABLET | Freq: Every day | ORAL | Status: DC

## 2012-04-07 NOTE — Patient Instructions (Addendum)
We have sent the following medications to your pharmacy for you to pick up at your convenience: Pepcid CC: Dr Oneta Rack

## 2012-04-07 NOTE — Progress Notes (Signed)
TORUNN CHANCELLOR 11/13/1922 MRN [id number]    History of Present Illness:  This is an 78 year old white female with chronic gastroesophageal reflux, who comes for refills of her Pepcid 40 mg every morning. She has a history of heme positive anemia while on Coumadin for atrial fibrillation and pulmonary emboli. She has a history of multiple adenomatous polyps on a colonoscopy from June 2011. She had an upper endoscopy at the same time which showed mild gastritis. She has no complaints today as far as her reflux is concerned. Her husband reports patient coughing when she drinks coffee or water. She denies any dysphagia to solids. There is a history of a retained common bile duct stone found on ERCP by Dr. Arlyce Dice in October 2011 when she underwent ERCP and sphincterotomy.   Past Medical History  Diagnosis Date  . B12 deficiency   . Myocardial infarct   . DDD (degenerative disc disease)   . Supraventricular tachycardia   . Iron deficiency anemia   . HLD (hyperlipidemia)   . Osteoporosis   . Pulmonary embolism   . CAD (coronary artery disease)   . Choledocholithiasis   . History of pancreatitis   . Diverticulosis   . Hiatal hernia   . Hx of adenomatous colonic polyps    Past Surgical History  Procedure Date  . Kyphosis surgery   . Vertebroplasty   . Laminectomy   . Cholecystectomy   . Cardiac catheterization   . Ercp with sphincterotomy     does not have a smoking history on file. She does not have any smokeless tobacco history on file. She reports that she does not use illicit drugs. Her alcohol history not on file. family history includes Diabetes in her son and Stroke in her brother and father.  There is no history of Colon cancer. No Known Allergies      Review of Systems: Choking on liquids but not on solids he denies soft heartburn or chest pain  The remainder of the 10 point ROS is negative except as outlined in H&P   Physical Exam: General appearance  Well developed, in  no distress. Obese in a wheelchair Eyes- non icteric. HEENT nontraumatic, normocephalic. Mouth no lesions, tongue papillated, no cheilosis. Neck supple without adenopathy, thyroid not enlarged, no carotid bruits, no JVD. Lungs Clear to auscultation bilaterally. Cor normal S1, normal S2, regular rhythm, no murmur,  quiet precordium. Abdomen: Obese soft nontender. Rectal: Not done. Extremities no pedal edema. Skin no lesions. Neurological alert and oriented x 3. Psychological normal mood and affect.  Assessment and Plan:  Problem #1 Gastroesophageal reflux controlled on Pepcid 40 mg daily. We will refill a 90 day supply for 1 year.  Problem #2 Choking on liquids consistent with esophageal dysmotility or possible aspiration due to upper esophageal sphincter dysfunction. I have discussed referral to a speech pathologist for a modified barium swallow. Her husband wants to wait longer. I have instructed patient in chin-tuck, avoiding ice cold liquids and drinking nectar-like liquids.   04/07/2012 Lina Sar

## 2012-04-20 ENCOUNTER — Other Ambulatory Visit: Payer: Self-pay | Admitting: Dermatology

## 2012-07-30 ENCOUNTER — Institutional Professional Consult (permissible substitution): Payer: Medicare Other | Admitting: Critical Care Medicine

## 2012-11-24 ENCOUNTER — Other Ambulatory Visit: Payer: Self-pay

## 2012-12-15 ENCOUNTER — Ambulatory Visit: Payer: Self-pay | Admitting: Cardiovascular Disease

## 2012-12-15 DIAGNOSIS — Z7901 Long term (current) use of anticoagulants: Secondary | ICD-10-CM | POA: Insufficient documentation

## 2012-12-15 DIAGNOSIS — Z86718 Personal history of other venous thrombosis and embolism: Secondary | ICD-10-CM

## 2013-02-01 ENCOUNTER — Ambulatory Visit (HOSPITAL_COMMUNITY)
Admission: RE | Admit: 2013-02-01 | Discharge: 2013-02-01 | Disposition: A | Discharge status: Home / Self Care | Payer: Medicare Other | Source: Ambulatory Visit | Attending: Physician Assistant | Admitting: Physician Assistant

## 2013-02-01 ENCOUNTER — Other Ambulatory Visit (HOSPITAL_COMMUNITY): Payer: Self-pay | Admitting: Physician Assistant

## 2013-02-01 ENCOUNTER — Ambulatory Visit: Payer: Medicare Other

## 2013-02-01 DIAGNOSIS — M545 Low back pain, unspecified: Secondary | ICD-10-CM | POA: Insufficient documentation

## 2013-02-01 DIAGNOSIS — M47817 Spondylosis without myelopathy or radiculopathy, lumbosacral region: Secondary | ICD-10-CM | POA: Insufficient documentation

## 2013-02-01 DIAGNOSIS — Z8781 Personal history of (healed) traumatic fracture: Secondary | ICD-10-CM | POA: Insufficient documentation

## 2013-02-01 DIAGNOSIS — I10 Essential (primary) hypertension: Secondary | ICD-10-CM

## 2013-02-01 DIAGNOSIS — R0602 Shortness of breath: Secondary | ICD-10-CM | POA: Insufficient documentation

## 2013-02-01 DIAGNOSIS — M5137 Other intervertebral disc degeneration, lumbosacral region: Secondary | ICD-10-CM | POA: Insufficient documentation

## 2013-02-01 DIAGNOSIS — Q762 Congenital spondylolisthesis: Secondary | ICD-10-CM | POA: Insufficient documentation

## 2013-02-01 DIAGNOSIS — M51379 Other intervertebral disc degeneration, lumbosacral region without mention of lumbar back pain or lower extremity pain: Secondary | ICD-10-CM | POA: Insufficient documentation

## 2013-02-01 DIAGNOSIS — J9819 Other pulmonary collapse: Secondary | ICD-10-CM | POA: Insufficient documentation

## 2013-02-10 ENCOUNTER — Encounter: Payer: Self-pay | Admitting: Gastroenterology

## 2013-02-10 ENCOUNTER — Ambulatory Visit (INDEPENDENT_AMBULATORY_CARE_PROVIDER_SITE_OTHER): Payer: Medicare Other | Admitting: Gastroenterology

## 2013-02-10 ENCOUNTER — Telehealth: Payer: Self-pay | Admitting: Internal Medicine

## 2013-02-10 VITALS — BP 142/84 | HR 70 | Ht 61.0 in | Wt 192.0 lb

## 2013-02-10 DIAGNOSIS — K5909 Other constipation: Secondary | ICD-10-CM

## 2013-02-10 DIAGNOSIS — K5900 Constipation, unspecified: Secondary | ICD-10-CM

## 2013-02-10 NOTE — Progress Notes (Signed)
02/10/2013 [name] [name] [id number] [birth year]   History of Present Illness:  Patient is a pleasant 79 year old female who is a patient of Dr. Regino Schultze.  She has chronic constipation issues due to her pain medication and other meds that she takes, but recently could not have a BM so she took Philips laxative without relief.  Saw her PCP and was given samples of Miralax, which she took on two occasions without relief.  Then she was given [place], which she took on Friday.  The amitiza worked, but no she has been moving her bowels several times a day.  Yesterday several times with accidents and incontinence and today had four BM's.  Wants to know if she can take some Kayopectate.    Current Medications, Allergies, Past Medical History, Past Surgical History, Family History and Social History were reviewed in [employer] record.   Physical Exam: BP 142/84  Pulse 70  Ht 5\' 1"  (1.549 m)  Wt 192 lb (87.091 kg)  BMI 36.3 kg/m2 General: Elderly white female in no acute distress; in wheelchair Head: Normocephalic and atraumatic Eyes:  sclerae anicteric, conjunctiva pink  Ears: Normal auditory acuity Lungs: Clear throughout to auscultation Heart: Regular rate and rhythm Abdomen: Soft, non-tender and non-distended. No masses, no hepatomegaly. Normal bowel sounds (slighly hyperactive). Rectal:  Deferred. Musculoskeletal: Symmetrical with no gross deformities. Neurological: Alert oriented x 4, grossly nonfocal Psychological:  Alert and cooperative. Normal mood and affect  Assessment and Recommendations: -Chronic constipation with some acute diarrhea/loose stools from taking several laxatives.  Will take a dose of Kayopectate this evening.  If that works, then she needs to begin a daily regimen of Miralax for her chronic constipation issues since she has now been opened up with other laxatives.

## 2013-02-10 NOTE — Telephone Encounter (Signed)
Husband calling to report the patient has been on pain medications d/t back pain. Patient says she did not have a bowel movement for 3 weeks.She tried Vear Clock laxative without relief. She saw Dr. Oneta Rack and was told to take Miralax which did not work. She took some "other pills" to soften stools. She is still on pain medication. Now she is having loose stools 3-4/day. Scheduled patient with Doug Sou, PA today at 1:30 PM to evaluate.

## 2013-02-10 NOTE — Patient Instructions (Addendum)
Take Kaopectate tonight and if you still do not have a bowel movement, then tomorrow start Miralax mixing 17 grams in 8 oz of water daily. We have given you samples of this but can be purchased over the counter.

## 2013-02-10 NOTE — Progress Notes (Signed)
Reviewed and agree.

## 2013-02-11 ENCOUNTER — Other Ambulatory Visit: Payer: Self-pay | Admitting: Pharmacist Clinician (PhC)/ Clinical Pharmacy Specialist

## 2013-02-11 MED ORDER — WARFARIN SODIUM 2.5 MG PO TABS: ORAL_TABLET | ORAL | Status: DC

## 2013-02-23 ENCOUNTER — Telehealth: Payer: Self-pay | Admitting: Internal Medicine

## 2013-02-23 NOTE — Telephone Encounter (Signed)
Patient is having loose stool.  She has some incontinent episodes.  They want to see Dr. Juanda Chance.  I have given them an appt for 02/26/13 3:45 with Dr. Juanda Chance.  They are offered an appt with the PA because they are not happy with the wait for Friday.  The husband refuses the appt, he wants her seen earlier.  I have explained that Dr. Juanda Chance only works 3 days a week including her endo time and that is the next opening with her, they are again offered an appt with a PA they refuse.  They want to know what they are to do until Friday.  I again offered a PA appt or their primary care.  They will call back if they change their mind and want to see a PA before the appt on Friday

## 2013-02-26 ENCOUNTER — Ambulatory Visit (INDEPENDENT_AMBULATORY_CARE_PROVIDER_SITE_OTHER): Payer: Medicare Other | Admitting: Pharmacist Clinician (PhC)/ Clinical Pharmacy Specialist

## 2013-02-26 ENCOUNTER — Ambulatory Visit (INDEPENDENT_AMBULATORY_CARE_PROVIDER_SITE_OTHER): Payer: Medicare Other | Admitting: Internal Medicine

## 2013-02-26 ENCOUNTER — Encounter: Payer: Self-pay | Admitting: Internal Medicine

## 2013-02-26 VITALS — BP 110/60 | HR 68

## 2013-02-26 VITALS — BP 148/88 | HR 72 | Ht 61.0 in | Wt 192.0 lb

## 2013-02-26 DIAGNOSIS — Z7901 Long term (current) use of anticoagulants: Secondary | ICD-10-CM

## 2013-02-26 DIAGNOSIS — K5900 Constipation, unspecified: Secondary | ICD-10-CM

## 2013-02-26 DIAGNOSIS — Z86718 Personal history of other venous thrombosis and embolism: Secondary | ICD-10-CM

## 2013-02-26 LAB — POCT INR: INR: 6.1

## 2013-02-26 NOTE — Patient Instructions (Addendum)
Take Miralax 1/2 capful tomorrow if you have not had a bowel movement. A alternative to Miralax is Senokot or Magnesium oxide which you would take 2 tablets at bedtime. If you have not had a bowel movement in 2 days then take another 1/2 capful.of Miralax   Call once a week and ask for Rene Kocher to give her an update on your symptoms.   Dr Oneta Rack

## 2013-02-26 NOTE — Progress Notes (Signed)
[name] [name] 1922-11-19 MRN [id number]        History of Present Illness:  This is a 79 year old white female with constipation alternating with diarrhea.e is wheelchair bound, except at the home she uses a walker. Her husband takes care of her. She no longer is able to cook so they eat prepared foods which is not high in fiber. Her constipation has been a chronic problem especially since she has been on pain medications. She saw Leveda Anna  2 weeks ago and was given MiraLax 17 g daily. She  developed severe diarrhea and incontinence and her husband had to clean up the bed and the carpets. The diarrhea persisted for several days . Today she had a first normal bowel movements. She and her husband would like some suggestion as to as specific bowel regimen that would not cause severe episodes of incontinence. Patient has a history of common bile duct stones which were removed in October 2010 and 2009. She has a history of biliary pancreatitis in 2007. Last colonoscopy in June 2011 showed multiple tubular adenomas. Because of her age we have not scheduled and  recall colonoscopies   Past Medical History  Diagnosis Date  . B12 deficiency   . Myocardial infarct   . DDD (degenerative disc disease)   . Supraventricular tachycardia   . Iron deficiency anemia   . HLD (hyperlipidemia)   . Osteoporosis   . Pulmonary embolism   . CAD (coronary artery disease)   . Choledocholithiasis   . History of pancreatitis   . Diverticulosis   . Hiatal hernia   . Hx of adenomatous colonic polyps    Past Surgical History  Procedure Laterality Date  . Kyphosis surgery    . Vertebroplasty    . Laminectomy    . Cholecystectomy    . Cardiac catheterization    . Ercp with sphincterotomy      reports that she has never smoked. She has never used smokeless tobacco. She reports that she does not drink alcohol or use illicit drugs. family history includes Diabetes in her son and Stroke in her brother and  father.  There is no history of Colon cancer. No Known Allergies      Review of Systems: Denies nausea vomiting or dysphagia  The remainder of the 10 point ROS is negative except as outlined in H&P   Physical Exam: General appearance  Well developed, in no distress. Remains in a wheelchair Eyes- non icteric. HEENT nontraumatic, normocephalic. Mouth no lesions, tongue papillated, no cheilosis. Neck supple without adenopathy, thyroid not enlarged, no carotid bruits, no JVD. Lungs Clear to auscultation bilaterally. Cor normal S1, normal S2, regular rhythm, no murmur,  quiet precordium. Abdomen: Obese soft abdomen with normoactive bowel sounds. Mild tenderness left lower quadrant Rectal: Not done Extremities no pedal edema. Skin no lesions. Neurological alert and oriented x 3. Psychological normal mood and affect.  Assessment and Plan:  78 year old white female with limited mobility who is on pain medications with chronic constipation. It is difficult to regulate her bowel habits because of possible overflow diarrhea. I have spent some time with the patient and her husband and be discussed specific regimen for her laxatives which would include 9 g of MiraLax instead of 17 g to a goal weight.diarrhea. She would take it only if the she does not have bowel movement for 24 hours. As an alternative she may take Senokot 2 tablets at bedtime or makes me she oxide 500 mg 2 tablets at  bedtime I also scope the husband to call our office with any questions and updates in her bowel habits so we can adjust the regimen   02/26/2013 Lina Sar

## 2013-03-03 ENCOUNTER — Inpatient Hospital Stay (HOSPITAL_COMMUNITY)
Admission: EM | Admit: 2013-03-03 | Discharge: 2013-03-06 | DRG: 563 | Disposition: A | Discharge status: Skilled Nursing Facility | Payer: Medicare Other | Attending: Internal Medicine | Admitting: Internal Medicine

## 2013-03-03 ENCOUNTER — Emergency Department (HOSPITAL_COMMUNITY): Payer: Medicare Other

## 2013-03-03 ENCOUNTER — Encounter (HOSPITAL_COMMUNITY): Payer: Self-pay | Admitting: Emergency Medicine

## 2013-03-03 DIAGNOSIS — I480 Paroxysmal atrial fibrillation: Secondary | ICD-10-CM

## 2013-03-03 DIAGNOSIS — R55 Syncope and collapse: Secondary | ICD-10-CM | POA: Diagnosis present

## 2013-03-03 DIAGNOSIS — S82401D Unspecified fracture of shaft of right fibula, subsequent encounter for closed fracture with routine healing: Secondary | ICD-10-CM

## 2013-03-03 DIAGNOSIS — I252 Old myocardial infarction: Secondary | ICD-10-CM

## 2013-03-03 DIAGNOSIS — W1789XA Other fall from one level to another, initial encounter: Secondary | ICD-10-CM | POA: Diagnosis present

## 2013-03-03 DIAGNOSIS — I2510 Atherosclerotic heart disease of native coronary artery without angina pectoris: Secondary | ICD-10-CM | POA: Diagnosis present

## 2013-03-03 DIAGNOSIS — I10 Essential (primary) hypertension: Secondary | ICD-10-CM

## 2013-03-03 DIAGNOSIS — M810 Age-related osteoporosis without current pathological fracture: Secondary | ICD-10-CM | POA: Diagnosis present

## 2013-03-03 DIAGNOSIS — Z9181 History of falling: Secondary | ICD-10-CM

## 2013-03-03 DIAGNOSIS — IMO0002 Reserved for concepts with insufficient information to code with codable children: Secondary | ICD-10-CM | POA: Diagnosis present

## 2013-03-03 DIAGNOSIS — Z7901 Long term (current) use of anticoagulants: Secondary | ICD-10-CM

## 2013-03-03 DIAGNOSIS — I471 Supraventricular tachycardia, unspecified: Secondary | ICD-10-CM | POA: Diagnosis present

## 2013-03-03 DIAGNOSIS — S82409A Unspecified fracture of shaft of unspecified fibula, initial encounter for closed fracture: Principal | ICD-10-CM

## 2013-03-03 DIAGNOSIS — E785 Hyperlipidemia, unspecified: Secondary | ICD-10-CM

## 2013-03-03 DIAGNOSIS — Z86711 Personal history of pulmonary embolism: Secondary | ICD-10-CM

## 2013-03-03 DIAGNOSIS — S82401A Unspecified fracture of shaft of right fibula, initial encounter for closed fracture: Secondary | ICD-10-CM

## 2013-03-03 DIAGNOSIS — Z8679 Personal history of other diseases of the circulatory system: Secondary | ICD-10-CM

## 2013-03-03 DIAGNOSIS — E538 Deficiency of other specified B group vitamins: Secondary | ICD-10-CM

## 2013-03-03 DIAGNOSIS — R296 Repeated falls: Secondary | ICD-10-CM

## 2013-03-03 DIAGNOSIS — E782 Mixed hyperlipidemia: Secondary | ICD-10-CM | POA: Diagnosis present

## 2013-03-03 DIAGNOSIS — S8290XD Unspecified fracture of unspecified lower leg, subsequent encounter for closed fracture with routine healing: Secondary | ICD-10-CM

## 2013-03-03 DIAGNOSIS — D518 Other vitamin B12 deficiency anemias: Secondary | ICD-10-CM | POA: Diagnosis present

## 2013-03-03 DIAGNOSIS — Z86718 Personal history of other venous thrombosis and embolism: Secondary | ICD-10-CM

## 2013-03-03 LAB — COMPREHENSIVE METABOLIC PANEL
Alkaline Phosphatase: 88 U/L (ref 39–117)
BUN: 15 mg/dL (ref 6–23)
Creatinine, Ser: 0.73 mg/dL (ref 0.50–1.10)
GFR calc Af Amer: 87 mL/min — ABNORMAL LOW (ref >90)
Glucose, Bld: 129 mg/dL — ABNORMAL HIGH (ref 70–99)
Potassium: 4.2 mEq/L (ref 3.5–5.1)
Total Protein: 6.3 g/dL (ref 6.0–8.3)

## 2013-03-03 LAB — URINALYSIS, ROUTINE W REFLEX MICROSCOPIC
Glucose, UA: NEGATIVE mg/dL
Hgb urine dipstick: NEGATIVE
Ketones, ur: 15 mg/dL — AB
Leukocytes, UA: NEGATIVE
Nitrite: NEGATIVE
Protein, ur: NEGATIVE mg/dL
Specific Gravity, Urine: 1.022 (ref 1.005–1.030)
Urobilinogen, UA: 4 mg/dL — ABNORMAL HIGH (ref 0.0–1.0)
pH: 7 (ref 5.0–8.0)

## 2013-03-03 LAB — CBC WITH DIFFERENTIAL/PLATELET
Basophils Absolute: 0 K/uL (ref 0.0–0.1)
Basophils Relative: 0 % (ref 0–1)
Eosinophils Absolute: 0 K/uL (ref 0.0–0.7)
Eosinophils Relative: 0 % (ref 0–5)
HCT: 41.8 % (ref 36.0–46.0)
Hemoglobin: 14.2 g/dL (ref 12.0–15.0)
Lymphocytes Relative: 11 % — ABNORMAL LOW (ref 12–46)
Lymphs Abs: 1.2 K/uL (ref 0.7–4.0)
MCH: 28.6 pg (ref 26.0–34.0)
MCHC: 34 g/dL (ref 30.0–36.0)
MCV: 84.3 fL (ref 78.0–100.0)
Monocytes Absolute: 1.9 K/uL — ABNORMAL HIGH (ref 0.1–1.0)
Monocytes Relative: 17 % — ABNORMAL HIGH (ref 3–12)
Neutro Abs: 7.9 K/uL — ABNORMAL HIGH (ref 1.7–7.7)
Neutrophils Relative %: 72 % (ref 43–77)
Platelets: 192 K/uL (ref 150–400)
RBC: 4.96 MIL/uL (ref 3.87–5.11)
RDW: 15.3 % (ref 11.5–15.5)
WBC: 11 K/uL — ABNORMAL HIGH (ref 4.0–10.5)

## 2013-03-03 MED ORDER — ONDANSETRON HCL 4 MG/2ML IJ SOLN: 4.0000 mg | Freq: Four times a day (QID) | INTRAMUSCULAR | Status: DC | PRN

## 2013-03-03 MED ORDER — SODIUM CHLORIDE 0.9 % IJ SOLN
3.0000 mL | Freq: Two times a day (BID) | INTRAMUSCULAR | Status: DC
  Administered 2013-03-04 – 2013-03-06 (×4): 3 mL via INTRAVENOUS

## 2013-03-03 MED ORDER — CLORAZEPATE DIPOTASSIUM 3.75 MG PO TABS
7.5000 mg | ORAL_TABLET | Freq: Every day | ORAL | Status: DC
  Administered 2013-03-04 – 2013-03-05 (×3): 7.5 mg via ORAL
  Filled 2013-03-03 (×3): qty 2

## 2013-03-03 MED ORDER — SODIUM CHLORIDE 0.9 % IV SOLN
INTRAVENOUS | Status: DC
  Administered 2013-03-03: 20:00:00 via INTRAVENOUS

## 2013-03-03 MED ORDER — ALBUTEROL SULFATE HFA 108 (90 BASE) MCG/ACT IN AERS
2.0000 | INHALATION_SPRAY | Freq: Four times a day (QID) | RESPIRATORY_TRACT | Status: DC | PRN
  Administered 2013-03-04: 2 via RESPIRATORY_TRACT
  Filled 2013-03-03: qty 6.7

## 2013-03-03 MED ORDER — SIMVASTATIN 10 MG PO TABS
10.0000 mg | ORAL_TABLET | Freq: Every day | ORAL | Status: DC
  Administered 2013-03-04 – 2013-03-05 (×2): 10 mg via ORAL
  Filled 2013-03-03 (×3): qty 1

## 2013-03-03 MED ORDER — ONDANSETRON HCL 4 MG PO TABS: 4.0000 mg | ORAL_TABLET | Freq: Four times a day (QID) | ORAL | Status: DC | PRN

## 2013-03-03 MED ORDER — SODIUM CHLORIDE 0.9 % IV SOLN: INTRAVENOUS | Status: DC

## 2013-03-03 MED ORDER — HYDROCODONE-ACETAMINOPHEN 5-325 MG PO TABS
1.0000 | ORAL_TABLET | Freq: Three times a day (TID) | ORAL | Status: DC | PRN
Start: 2013-03-03 — End: 2013-03-06
  Administered 2013-03-03 – 2013-03-06 (×4): 1 via ORAL
  Filled 2013-03-03 (×4): qty 1

## 2013-03-03 MED ORDER — SODIUM CHLORIDE 0.9 % IJ SOLN: 3.0000 mL | INTRAMUSCULAR | Status: DC | PRN

## 2013-03-03 MED ORDER — SODIUM CHLORIDE 0.9 % IV SOLN: 250.0000 mL | INTRAVENOUS | Status: DC | PRN

## 2013-03-03 MED ORDER — HYDROCODONE-ACETAMINOPHEN 5-325 MG PO TABS: 2.0000 | ORAL_TABLET | ORAL | Status: DC | PRN

## 2013-03-03 MED ORDER — SIMVASTATIN 40 MG PO TABS: 40.0000 mg | ORAL_TABLET | Freq: Every day | ORAL | Status: DC

## 2013-03-03 MED ORDER — SODIUM CHLORIDE 0.9 % IJ SOLN
3.0000 mL | Freq: Two times a day (BID) | INTRAMUSCULAR | Status: DC
  Administered 2013-03-05: 3 mL via INTRAVENOUS

## 2013-03-03 MED ORDER — FAMOTIDINE 40 MG PO TABS
40.0000 mg | ORAL_TABLET | Freq: Every day | ORAL | Status: DC
  Administered 2013-03-04 – 2013-03-06 (×4): 40 mg via ORAL
  Filled 2013-03-03 (×4): qty 1

## 2013-03-03 MED ORDER — DILTIAZEM HCL 60 MG PO TABS
120.0000 mg | ORAL_TABLET | Freq: Every day | ORAL | Status: DC
  Administered 2013-03-04 – 2013-03-06 (×4): 120 mg via ORAL
  Filled 2013-03-03 (×4): qty 2

## 2013-03-03 NOTE — Progress Notes (Signed)
CSW met with husband and son at the bedside to discuss SNF's and HHPT for Pt.   Pt's Insurance does not permit Pt to be placed in a SNF from the ED, however CSW spoke with Pt's family about the process of placement from home. CSW provided information for reference and placement.   CSW to follow for d/c planning.    Leron Croak, LCSWA [facility] Emergency Dept.  [phone]

## 2013-03-03 NOTE — ED Notes (Signed)
Patient transported to X-ray 

## 2013-03-03 NOTE — Progress Notes (Signed)
Received CM from Dr Freida Busman for discharge planning- home health services.[facility] list/ private duty written resources provided to patient.Patient husband have verbalized    Understanding  Of  Written resources.[facility]. Referral placed with H[facility] at [facility] to  see patient.Patient and husband aware [facility] will be assist.

## 2013-03-03 NOTE — ED Provider Notes (Signed)
I was asked by case management to become involved in this patient's care.  She apparently has become more weak and is falling more frequently at home and the husband is unable to care for her.  Arrangements were being made for home health, however they do not feel as though they can adequately take her.  I have spoken with Dr. Cena Benton from medicine who agrees to admit to his service.    Geoffery Lyons, MD 03/03/13 [phone]

## 2013-03-03 NOTE — ED Notes (Signed)
Social work at bedside.  

## 2013-03-03 NOTE — Consult Note (Signed)
Reason for Consult:  Right fibula shaft fracture and leg pain Referring Physician:   ED MD  [name] [name] is an 79 y.o. female.  HPI:   79 yo female brought to the ER after sustaining a mechanical fall.  Reports right leg pain and bilateral LE weakness.  X-rays in the ED show a stable right fibula fracture at the shaft.  Ortho is consulted for recs.  The family does report that she has been unsteady on her feet and has been in decline over the past 4 weeks in terms of weakness and falls.  Past Medical History  Diagnosis Date  . B12 deficiency   . Myocardial infarct   . DDD (degenerative disc disease)   . Supraventricular tachycardia   . Iron deficiency anemia   . HLD (hyperlipidemia)   . Osteoporosis   . Pulmonary embolism   . CAD (coronary artery disease)   . Choledocholithiasis   . History of pancreatitis   . Diverticulosis   . Hiatal hernia   . Hx of adenomatous colonic polyps     Past Surgical History  Procedure Laterality Date  . Kyphosis surgery    . Vertebroplasty    . Laminectomy    . Cholecystectomy    . Cardiac catheterization    . Ercp with sphincterotomy      Family History  Problem Relation Age of Onset  . Stroke Father   . Stroke Brother   . Diabetes Son   . Colon cancer Neg Hx     Social History:  reports that she has never smoked. She has never used smokeless tobacco. She reports that she does not drink alcohol or use illicit drugs.  Allergies: No Known Allergies  Medications: I have reviewed the patient's current medications.  Results for orders placed during the hospital encounter of 03/03/13 (from the past 48 hour(s))  CBC WITH DIFFERENTIAL     Status: Abnormal   Collection Time    03/03/13 11:35 AM      Result Value Range   WBC 11.0 (*) 4.0 - 10.5 K/uL   RBC 4.96  3.87 - 5.11 MIL/uL   Hemoglobin 14.2  12.0 - 15.0 g/dL   HCT 56.2  13.0 - 86.5 %   MCV 84.3  78.0 - 100.0 fL   MCH 28.6  26.0 - 34.0 pg   MCHC 34.0  30.0 - 36.0 g/dL   RDW 78.4   69.6 - 29.5 %   Platelets 192  150 - 400 K/uL   Neutrophils Relative % 72  43 - 77 %   Neutro Abs 7.9 (*) 1.7 - 7.7 K/uL   Lymphocytes Relative 11 (*) 12 - 46 %   Lymphs Abs 1.2  0.7 - 4.0 K/uL   Monocytes Relative 17 (*) 3 - 12 %   Monocytes Absolute 1.9 (*) 0.1 - 1.0 K/uL   Eosinophils Relative 0  0 - 5 %   Eosinophils Absolute 0.0  0.0 - 0.7 K/uL   Basophils Relative 0  0 - 1 %   Basophils Absolute 0.0  0.0 - 0.1 K/uL  COMPREHENSIVE METABOLIC PANEL     Status: Abnormal   Collection Time    03/03/13 11:35 AM      Result Value Range   Sodium 135  135 - 145 mEq/L   Potassium 4.2  3.5 - 5.1 mEq/L   Chloride 99  96 - 112 mEq/L   CO2 27  19 - 32 mEq/L   Glucose, Bld 129 (*)  70 - 99 mg/dL   BUN 15  6 - 23 mg/dL   Creatinine, Ser 1.61  0.50 - 1.10 mg/dL   Calcium 9.4  8.4 - 09.6 mg/dL   Total Protein 6.3  6.0 - 8.3 g/dL   Albumin 3.4 (*) 3.5 - 5.2 g/dL   AST 17  0 - 37 U/L   ALT 14  0 - 35 U/L   Alkaline Phosphatase 88  39 - 117 U/L   Total Bilirubin 1.5 (*) 0.3 - 1.2 mg/dL   GFR calc non Af Amer 75 (*) >90 mL/min   GFR calc Af Amer 87 (*) >90 mL/min   Comment:            The eGFR has been calculated     using the CKD EPI equation.     This calculation has not been     validated in all clinical     situations.     eGFR's persistently     <90 mL/min signify     possible Chronic Kidney Disease.  PROTIME-INR     Status: Abnormal   Collection Time    03/03/13 11:35 AM      Result Value Range   Prothrombin Time 18.9 (*) 11.6 - 15.2 seconds   INR 1.64 (*) 0.00 - 1.49  URINALYSIS, ROUTINE W REFLEX MICROSCOPIC     Status: Abnormal   Collection Time    03/03/13 12:29 PM      Result Value Range   Color, Urine YELLOW  YELLOW   APPearance CLEAR  CLEAR   Specific Gravity, Urine 1.022  1.005 - 1.030   pH 7.0  5.0 - 8.0   Glucose, UA NEGATIVE  NEGATIVE mg/dL   Hgb urine dipstick NEGATIVE  NEGATIVE   Bilirubin Urine SMALL (*) NEGATIVE   Ketones, ur 15 (*) NEGATIVE mg/dL    Protein, ur NEGATIVE  NEGATIVE mg/dL   Urobilinogen, UA 4.0 (*) 0.0 - 1.0 mg/dL   Nitrite NEGATIVE  NEGATIVE   Leukocytes, UA NEGATIVE  NEGATIVE   Comment: MICROSCOPIC NOT DONE ON URINES WITH NEGATIVE PROTEIN, BLOOD, LEUKOCYTES, NITRITE, OR GLUCOSE <1000 mg/dL.    Dg Pelvis 1-2 Views  03/03/2013   *RADIOLOGY REPORT*  Clinical Data: Fall  PELVIS - 1-2 VIEW  Comparison: 02/01/2013  Findings: Single frontal view of the pelvis submitted. No acute fracture or subluxation.  Diffuse osteopenia.  Stable old compression fracture of the L4 vertebral body.  Degenerative changes pubic symphysis.  IMPRESSION: No acute fracture or subluxation.  Diffuse osteopenia.   Original Report Authenticated By: Natasha Mead, M.D.   Dg Femur Right  03/03/2013   *RADIOLOGY REPORT*  Clinical Data: Fall  RIGHT FEMUR - 2 VIEW  Comparison: None  Findings:  Four views of the right femur submitted.  No acute fracture or subluxation.  There is diffuse osteopenia.  Degenerative changes pubic symphysis.  IMPRESSION: No acute fracture or subluxation.  Diffuse osteopenia.   Original Report Authenticated By: Natasha Mead, M.D.   Dg Tibia/fibula Right  03/03/2013   *RADIOLOGY REPORT*  Clinical Data: Fall, leg pain  RIGHT TIBIA AND FIBULA - 2 VIEW  Comparison: None.  Findings: Four views of the right tibia-fibula submitted.  There is mild displaced fracture proximal shaft of the right fibula. Diffuse osteopenia.  IMPRESSION: Mild displaced fracture of proximal right fibula.  Diffuse osteopenia.   Original Report Authenticated By: Natasha Mead, M.D.   Dg Ankle Complete Right  03/03/2013   *RADIOLOGY REPORT*  Clinical Data: Fall  RIGHT ANKLE - COMPLETE 3+ VIEW  Comparison: None.  Findings: Three views of the right ankle submitted.  There is old appearing fracture deformity in distal right fibula.  Ankle mortise is preserved.  No definite acute fracture or subluxation.  Plantar spur of the calcaneus. Diffuse osteopenia.  IMPRESSION: Old appearing fracture  of distal right fibula.  Ankle mortise is preserved.  No definite acute fracture or subluxation.  Diffuse osteopenia.   Original Report Authenticated By: Natasha Mead, M.D.   Dg Knee Complete 4 Views Right  03/03/2013   *RADIOLOGY REPORT*  Clinical Data: Fall  RIGHT KNEE - COMPLETE 4+ VIEW  Comparison: None.  Findings: Four views of the right knee submitted.  No knee fracture or subluxation.  Narrowing of medial joint compartment.  Diffuse osteopenia.  Mild chondrocalcinosis.  Spurring lateral femoral condyle and lateral tibial plateau.  Mild displaced fracture proximal right fibular shaft.  Narrowing of patellofemoral joint space.  Spurring of patella.  IMPRESSION: No knee fracture or subluxation.  Narrowing of medial joint compartment.  Diffuse osteopenia.  Mild chondrocalcinosis. Spurring lateral femoral condyle and lateral tibial plateau.  Mild displaced fracture proximal right fibular shaft.   Original Report Authenticated By: Natasha Mead, M.D.    ROS Blood pressure 159/77, pulse 94, temperature 98.6 F (37 C), temperature source Oral, resp. rate 18, SpO2 94.00%. Physical Exam  Musculoskeletal:       Right knee: She exhibits effusion.       Right ankle: She exhibits swelling and ecchymosis.       Right lower leg: She exhibits bony tenderness and swelling.       Legs:   Assessment/Plan: Right fibular shaft fracture and leg pain in a 79 yo female who was had worsening weakness over the last several weeks with loss of balance 1)  As far as the fibula goes, I would not treat this in any splint or boot due to wear the fracture is located.  A boot or splint will hinge at the fracture site causing more pain.  Also, either would be too heavy for her given her weakness.  She is certainly a fall risk and has balance issues.  I do not mind her putting weight on that right leg as comfort allows, but she should likely just pivot to a wheel chair.  I spoke with her husband and daughter who both understand she  will need therapy and likely SNF placement.  Abrielle Finck Y 03/03/2013, 7:30 PM

## 2013-03-03 NOTE — ED Notes (Addendum)
Pt from home c/o fall x 3 yesterday, pain in right leg. Pt states pain is from knee down, will touch right upper leg and says it hurts her. Per ems, pt denies loc. Pt is on blood thinners, has hx of falls, and is a poor historian. Husband at bedside.

## 2013-03-03 NOTE — ED Notes (Signed)
Social worker called Per request of PT husband. Husband reports He is unable to Ambulate at home and fell 3 times Tuesday. Pt now has a FX RT Leg. Husband states " I can not take care of her by my self."

## 2013-03-03 NOTE — Progress Notes (Signed)
Addendum 03/03/13  16:00  W.Aundria Rud RN  Spoke with Patient husband, and he states, she uses a wheel chair and a walker at home, before these falls. Pt now has a  Fibula fracture  NWB.  He stated that  right  before  her  falls she complained of being weak and dizzy.   Dr. Judd Lien was made aware of husbands concerns. Pt was re-evaluated consult placed for medical consult. Dr. Cena Benton evaluated pt. Dr. Cena Benton has decided on inpatient  admission. Message left for [facility] concerning inpatient admissiion. Explained to patient that CM and SW will f/u on 5N.

## 2013-03-03 NOTE — ED Notes (Signed)
Pt and husband still have a lot of questions for social work and are concerned about her plan of care. Social work involved. Discharge instructions reviewed. Care handoff to [facility].

## 2013-03-03 NOTE — Progress Notes (Signed)
Orthopedic Tech Progress Note Patient Details:  [name] [name] [birth year] [id number]  Ortho Devices Type of Ortho Device: Ace wrap;Stirrup splint Ortho Device/Splint Location: right leg Ortho Device/Splint Interventions: Application   [name] 03/03/2013, 3:03 PM

## 2013-03-03 NOTE — Progress Notes (Signed)
Unit CM UR Completed by [facility] ED CM  W. Dulcie Gammon RN  

## 2013-03-03 NOTE — ED Notes (Signed)
Social worker at bed side talking with PT and Husband.

## 2013-03-03 NOTE — H&P (Signed)
[facility] History and Physical  [name] [name] AOZ:[id number] DOB: 04-Apr-1923 DOA: 03/03/2013  Referring physician: Dr. Judd Lien PCP: Nadean Corwin, MD  Specialists: [facility] heart and vascular  Chief Complaint: multiple falls, generalized weakness  HPI: [name] [name] is a 79 y.o. female  With history of PE in 2007 reportedly on coumadin, h/o supraventricular tachycardia, CAD, B 12 deficit.  Presented to the hospital after fall with subsequent right leg pain.  Husband reports that wife usually ambulates with the assistance of a walker but recently within the last month she has fallen around 5 times.  Nothing makes the problem better or worse and the problem has been persistent.  Patient in the ED was found to have mild displaced fracture of the priximal right fibula.  We were subsequently called for evaluation and recommendations due to weakness.  Review of Systems: 10 point review of system reviewed and negative unless otherwise mentioned above.  Past Medical History  Diagnosis Date  . B12 deficiency   . Myocardial infarct   . DDD (degenerative disc disease)   . Supraventricular tachycardia   . Iron deficiency anemia   . HLD (hyperlipidemia)   . Osteoporosis   . Pulmonary embolism   . CAD (coronary artery disease)   . Choledocholithiasis   . History of pancreatitis   . Diverticulosis   . Hiatal hernia   . Hx of adenomatous colonic polyps    Past Surgical History  Procedure Laterality Date  . Kyphosis surgery    . Vertebroplasty    . Laminectomy    . Cholecystectomy    . Cardiac catheterization    . Ercp with sphincterotomy     Social History:  reports that she has never smoked. She has never used smokeless tobacco. She reports that she does not drink alcohol or use illicit drugs.  where does patient live--home, ALF, SNF? and with whom if at home?lives at home with husband. Currently he feels that he is unable to care for her.  Can patient participate in ADLs?  Not currently  No Known Allergies  Family History  Problem Relation Age of Onset  . Stroke Father   . Stroke Brother   . Diabetes Son   . Colon cancer Neg Hx   None reported.  Prior to Admission medications   Medication Sig Start Date End Date Taking? Authorizing Provider  albuterol (PROVENTIL HFA;VENTOLIN HFA) 108 (90 BASE) MCG/ACT inhaler Inhale 2 puffs into the lungs every 6 (six) hours as needed for wheezing.   Yes Historical Provider, MD  bumetanide (BUMEX) 1 MG tablet Take 0.5-1 mg by mouth daily as needed (swelling). Take 1/2-1 tablet by mouth every morning prn   Yes Historical Provider, MD  Cholecalciferol (VITAMIN D-3 PO) Take 2,000 mg by mouth daily.    Yes Historical Provider, MD  clorazepate (TRANXENE) 7.5 MG tablet Take 7.5 mg by mouth at bedtime.   Yes Historical Provider, MD  diltiazem (CARDIZEM) 120 MG tablet Take 120 mg by mouth daily.   Yes Historical Provider, MD  famotidine (PEPCID) 40 MG tablet Take 1 tablet (40 mg total) by mouth daily. 04/07/12  Yes Hart Carwin, MD  HYDROcodone-acetaminophen (NORCO/VICODIN) 5-325 MG per tablet Take 1 tablet by mouth every 8 (eight) hours as needed for pain.   Yes Historical Provider, MD  MAGNESIUM PO Take 1 tablet by mouth daily.   Yes Historical Provider, MD  pravastatin (PRAVACHOL) 40 MG tablet Take 40 mg by mouth daily.   Yes Historical Provider, MD  warfarin (COUMADIN) 2.5 MG tablet Take 2.5 mg by mouth at bedtime.  02/11/13  Yes Phillips Hay, RPH-CPP  HYDROcodone-acetaminophen (NORCO/VICODIN) 5-325 MG per tablet Take 2 tablets by mouth every 4 (four) hours as needed for pain. 03/03/13   Toy Baker, MD   Physical Exam: Filed Vitals:   03/03/13 1005 03/03/13 1011  BP: 151/56   Pulse: 97   Temp: 98 F (36.7 C)   TempSrc: Oral   Resp: 20   SpO2: 94% 94%     General:  Pt in NAD, Alert and Awake  Eyes: EOMI, non icteric  ENT: normal exterior appearance, no masses on visual examination  Neck: supple, no  goiter  Cardiovascular: normal s1 and s2, no mrg  Respiratory: CTA BL, no wheezes  Abdomen: soft, NT, ND  Skin: warm and dry  Musculoskeletal: right leg in splint  Psychiatric: mood and affect appropriate  Neurologic: moves all extremities, and answers questions appropriately, no facial asymmetry  Labs on Admission:  Basic Metabolic Panel:  Recent Labs Lab 03/03/13 1135  NA 135  K 4.2  CL 99  CO2 27  GLUCOSE 129*  BUN 15  CREATININE 0.73  CALCIUM 9.4   Liver Function Tests:  Recent Labs Lab 03/03/13 1135  AST 17  ALT 14  ALKPHOS 88  BILITOT 1.5*  PROT 6.3  ALBUMIN 3.4*   No results found for this basename: LIPASE, AMYLASE,  in the last 168 hours No results found for this basename: AMMONIA,  in the last 168 hours CBC:  Recent Labs Lab 03/03/13 1135  WBC 11.0*  NEUTROABS 7.9*  HGB 14.2  HCT 41.8  MCV 84.3  PLT 192   Cardiac Enzymes: No results found for this basename: CKTOTAL, CKMB, CKMBINDEX, TROPONINI,  in the last 168 hours  BNP (last 3 results) No results found for this basename: PROBNP,  in the last 8760 hours CBG: No results found for this basename: GLUCAP,  in the last 168 hours  Radiological Exams on Admission: Dg Pelvis 1-2 Views  03/03/2013   *RADIOLOGY REPORT*  Clinical Data: Fall  PELVIS - 1-2 VIEW  Comparison: 02/01/2013  Findings: Single frontal view of the pelvis submitted. No acute fracture or subluxation.  Diffuse osteopenia.  Stable old compression fracture of the L4 vertebral body.  Degenerative changes pubic symphysis.  IMPRESSION: No acute fracture or subluxation.  Diffuse osteopenia.   Original Report Authenticated By: Natasha Mead, M.D.   Dg Femur Right  03/03/2013   *RADIOLOGY REPORT*  Clinical Data: Fall  RIGHT FEMUR - 2 VIEW  Comparison: None  Findings:  Four views of the right femur submitted.  No acute fracture or subluxation.  There is diffuse osteopenia.  Degenerative changes pubic symphysis.  IMPRESSION: No acute fracture  or subluxation.  Diffuse osteopenia.   Original Report Authenticated By: Natasha Mead, M.D.   Dg Tibia/fibula Right  03/03/2013   *RADIOLOGY REPORT*  Clinical Data: Fall, leg pain  RIGHT TIBIA AND FIBULA - 2 VIEW  Comparison: None.  Findings: Four views of the right tibia-fibula submitted.  There is mild displaced fracture proximal shaft of the right fibula. Diffuse osteopenia.  IMPRESSION: Mild displaced fracture of proximal right fibula.  Diffuse osteopenia.   Original Report Authenticated By: Natasha Mead, M.D.   Dg Ankle Complete Right  03/03/2013   *RADIOLOGY REPORT*  Clinical Data: Fall  RIGHT ANKLE - COMPLETE 3+ VIEW  Comparison: None.  Findings: Three views of the right ankle submitted.  There is old appearing fracture  deformity in distal right fibula.  Ankle mortise is preserved.  No definite acute fracture or subluxation.  Plantar spur of the calcaneus. Diffuse osteopenia.  IMPRESSION: Old appearing fracture of distal right fibula.  Ankle mortise is preserved.  No definite acute fracture or subluxation.  Diffuse osteopenia.   Original Report Authenticated By: Natasha Mead, M.D.   Dg Knee Complete 4 Views Right  03/03/2013   *RADIOLOGY REPORT*  Clinical Data: Fall  RIGHT KNEE - COMPLETE 4+ VIEW  Comparison: None.  Findings: Four views of the right knee submitted.  No knee fracture or subluxation.  Narrowing of medial joint compartment.  Diffuse osteopenia.  Mild chondrocalcinosis.  Spurring lateral femoral condyle and lateral tibial plateau.  Mild displaced fracture proximal right fibular shaft.  Narrowing of patellofemoral joint space.  Spurring of patella.  IMPRESSION: No knee fracture or subluxation.  Narrowing of medial joint compartment.  Diffuse osteopenia.  Mild chondrocalcinosis. Spurring lateral femoral condyle and lateral tibial plateau.  Mild displaced fracture proximal right fibular shaft.   Original Report Authenticated By: Natasha Mead, M.D.    Assessment/Plan Active Problems:    HYPERLIPIDEMIA   CORONARY ARTERY DISEASE   PULMONARY EMBOLISM, HX OF   SUPRAVENTRICULAR TACHYCARDIA, PAROXYSMAL, HX OF   Long term (current) use of anticoagulants   Fibula fracture   Multiple falls   1. Fibula fracture - I have requested that the ED consult orthopaedic surgery for their plans and recommendations regarding this problem - supportive treatment  2. Paroxysmal SVT - continue home regimen - stable currently  3. H/o PE - pharmacy to dose coumadin, although given multiple fall there is risk vs benefits may have to be reassessed.  At this point will continue coumadin per pharmacy consult although would recommend ortho's input prior to administration.  Hold coumadin until evaluated by ortho.  4.  CAD - continue statin  5. Multiple falls - complicating decision to continue anticoagulation - will plan on having PT/OT evaluate patient. - TSH - dietitian to assess for malnutrition - vitamin b12 and folate - morning cortisol  Code Status: Full Family Communication: discussed with spouse Disposition Plan: Pending further test results and recommendations by ortho and physical therapy  Time spent: > 35 minutes.  Penny Pia [facility] Pager [phone]  If 7PM-7AM, please contact night-coverage [email or web address] Password [facility] 03/03/2013, 6:09 PM

## 2013-03-03 NOTE — ED Provider Notes (Signed)
History     CSN: [id number]  Arrival date & time 03/03/13  4098   First MD Initiated Contact with Patient 03/03/13 1038      Chief Complaint  Patient presents with  . Fall  . Leg Pain    (Consider location/radiation/quality/duration/timing/severity/associated sxs/prior treatment) Patient is a 79 y.o. female presenting with fall and leg pain. The history is provided by the patient and the spouse.  Fall  Leg Pain  patient here complaining of right hip knee thigh and ankle pain after repeated falls in the past 2 days. States that her legs give out on her and she also ground. Denies any head trauma. No neck or back pain. No abdominal or chest pain. No recent fever, vomiting, diarrhea. No black or bloody stools. She is unable to ambulate at this time. No treatment used prior to arrival  Past Medical History  Diagnosis Date  . B12 deficiency   . Myocardial infarct   . DDD (degenerative disc disease)   . Supraventricular tachycardia   . Iron deficiency anemia   . HLD (hyperlipidemia)   . Osteoporosis   . Pulmonary embolism   . CAD (coronary artery disease)   . Choledocholithiasis   . History of pancreatitis   . Diverticulosis   . Hiatal hernia   . Hx of adenomatous colonic polyps     Past Surgical History  Procedure Laterality Date  . Kyphosis surgery    . Vertebroplasty    . Laminectomy    . Cholecystectomy    . Cardiac catheterization    . Ercp with sphincterotomy      Family History  Problem Relation Age of Onset  . Stroke Father   . Stroke Brother   . Diabetes Son   . Colon cancer Neg Hx     History  Substance Use Topics  . Smoking status: Never Smoker   . Smokeless tobacco: Never Used  . Alcohol Use: No    OB History   Grav Para Term Preterm Abortions TAB SAB Ect Mult Living                  Review of Systems  All other systems reviewed and are negative.    Allergies  Review of patient's allergies indicates no known allergies.  Home  Medications   Current Outpatient Rx  Name  Route  Sig  Dispense  Refill  . albuterol (PROVENTIL HFA;VENTOLIN HFA) 108 (90 BASE) MCG/ACT inhaler   Inhalation   Inhale 2 puffs into the lungs every 6 (six) hours as needed for wheezing.         . bumetanide (BUMEX) 1 MG tablet   Oral   Take 0.5-1 mg by mouth daily as needed (swelling). Take 1/2-1 tablet by mouth every morning prn         . Cholecalciferol (VITAMIN D-3 PO)   Oral   Take 2,000 mg by mouth daily.          . clorazepate (TRANXENE) 7.5 MG tablet   Oral   Take 7.5 mg by mouth at bedtime.         Marland Kitchen diltiazem (CARDIZEM) 120 MG tablet   Oral   Take 120 mg by mouth daily.         . famotidine (PEPCID) 40 MG tablet   Oral   Take 1 tablet (40 mg total) by mouth daily.   90 tablet   3   . HYDROcodone-acetaminophen (NORCO/VICODIN) 5-325 MG per tablet   Oral  Take 1 tablet by mouth every 8 (eight) hours as needed for pain.         Marland Kitchen MAGNESIUM PO   Oral   Take 1 tablet by mouth daily.         . pravastatin (PRAVACHOL) 40 MG tablet   Oral   Take 40 mg by mouth daily.         . predniSONE (DELTASONE) 20 MG tablet   Oral   Take 20 mg by mouth daily.         Marland Kitchen warfarin (COUMADIN) 2.5 MG tablet   Oral   Take 2.5 mg by mouth at bedtime.            BP 151/56  Pulse 97  Temp(Src) 98 F (36.7 C) (Oral)  Resp 20  SpO2 94%  Physical Exam  Nursing note and vitals reviewed. Constitutional: She is oriented to person, place, and time. She appears well-developed and well-nourished.  Non-toxic appearance. No distress.  HENT:  Head: Normocephalic and atraumatic.  Eyes: Conjunctivae, EOM and lids are normal. Pupils are equal, round, and reactive to light.  Neck: Normal range of motion. Neck supple. No tracheal deviation present. No mass present.  Cardiovascular: Normal rate, regular rhythm and normal heart sounds.  Exam reveals no gallop.   No murmur heard. Pulmonary/Chest: Effort normal and breath  sounds normal. No stridor. No respiratory distress. She has no decreased breath sounds. She has no wheezes. She has no rhonchi. She has no rales.  Abdominal: Soft. Normal appearance and bowel sounds are normal. She exhibits no distension. There is no tenderness. There is no rebound and no CVA tenderness.  Musculoskeletal: Normal range of motion. She exhibits no edema and no tenderness.  Pain to palpation at right femur, lateral hip, knee, lower extremity. She has multiple areas of ecchymosis noted at bilateral lower extremities as well as her upper extremities. Right leg is shortened and rotated. Right dorsalis pedis pulses palpable.  Neurological: She is alert and oriented to person, place, and time. She has normal strength. No cranial nerve deficit or sensory deficit. GCS eye subscore is 4. GCS verbal subscore is 5. GCS motor subscore is 6.  Skin: Skin is warm and dry. No abrasion and no rash noted.  Psychiatric: Her affect is blunt. Her speech is delayed. She is slowed.    ED Course  Procedures (including critical care time)  Labs Reviewed  CBC WITH DIFFERENTIAL  COMPREHENSIVE METABOLIC PANEL  URINALYSIS, ROUTINE W REFLEX MICROSCOPIC  PROTIME-INR   No results found.   No diagnosis found.    MDM  Splint placed to right le for non-displaced fibula fx--social worker consulted--will set up home serivces        Toy Baker, MD 03/03/13 [phone]

## 2013-03-04 ENCOUNTER — Encounter (HOSPITAL_COMMUNITY): Payer: Self-pay | Admitting: Cardiology

## 2013-03-04 ENCOUNTER — Inpatient Hospital Stay (HOSPITAL_COMMUNITY): Payer: Medicare Other

## 2013-03-04 DIAGNOSIS — I4891 Unspecified atrial fibrillation: Secondary | ICD-10-CM

## 2013-03-04 DIAGNOSIS — E785 Hyperlipidemia, unspecified: Secondary | ICD-10-CM

## 2013-03-04 DIAGNOSIS — I2510 Atherosclerotic heart disease of native coronary artery without angina pectoris: Secondary | ICD-10-CM

## 2013-03-04 LAB — BASIC METABOLIC PANEL
BUN: 15 mg/dL (ref 6–23)
Chloride: 98 mEq/L (ref 96–112)
GFR calc Af Amer: 89 mL/min — ABNORMAL LOW (ref >90)
GFR calc non Af Amer: 77 mL/min — ABNORMAL LOW (ref >90)
Potassium: 3.9 mEq/L (ref 3.5–5.1)
Sodium: 136 mEq/L (ref 135–145)

## 2013-03-04 LAB — PROTIME-INR
INR: 1.54 — ABNORMAL HIGH (ref 0.00–1.49)
Prothrombin Time: 18 seconds — ABNORMAL HIGH (ref 11.6–15.2)

## 2013-03-04 LAB — CBC
HCT: 41.4 % (ref 36.0–46.0)
Hemoglobin: 13.8 g/dL (ref 12.0–15.0)
MCHC: 33.3 g/dL (ref 30.0–36.0)
RBC: 4.87 MIL/uL (ref 3.87–5.11)
WBC: 11.8 10*3/uL — ABNORMAL HIGH (ref 4.0–10.5)

## 2013-03-04 LAB — VITAMIN B12: Vitamin B-12: 143 pg/mL — ABNORMAL LOW (ref 211–911)

## 2013-03-04 LAB — FOLATE: Folate: 16.2 ng/mL

## 2013-03-04 LAB — TSH: TSH: 1.494 u[IU]/mL (ref 0.350–4.500)

## 2013-03-04 MED ORDER — WARFARIN SODIUM 4 MG PO TABS
4.0000 mg | ORAL_TABLET | Freq: Once | ORAL | Status: DC
  Filled 2013-03-04: qty 1

## 2013-03-04 MED ORDER — CYANOCOBALAMIN 1000 MCG/ML IJ SOLN
1000.0000 ug | Freq: Every day | INTRAMUSCULAR | Status: DC
  Administered 2013-03-04 – 2013-03-06 (×3): 1000 ug via INTRAMUSCULAR
  Filled 2013-03-04 (×3): qty 1

## 2013-03-04 MED ORDER — WARFARIN SODIUM 4 MG PO TABS
4.0000 mg | ORAL_TABLET | ORAL | Status: DC
  Filled 2013-03-04: qty 1

## 2013-03-04 MED ORDER — WARFARIN - PHARMACIST DOSING INPATIENT: Freq: Every day | Status: DC

## 2013-03-04 MED ORDER — ENSURE COMPLETE PO LIQD
237.0000 mL | ORAL | Status: DC
  Administered 2013-03-05: 237 mL via ORAL

## 2013-03-04 NOTE — Progress Notes (Signed)
RT Note: RT called to give PRN Albuterol. Pt states she is not having difficulty breathing & does not need inhaler at this time. BBS diminished, VS stable, RT to monitor.

## 2013-03-04 NOTE — Progress Notes (Signed)
[facility] PROGRESS NOTE  [name] [name]:[id number] DOB: 1923/03/30 DOA: 03/03/2013 PCP: Nadean Corwin, MD  Assessment/Plan: 1. Fibula fracture -per ortho recommendations no surgical interventions, splint or cast are required.  -weight bearing as tolerated -PT/OT  2. Paroxysmal SVT /atrial fibrillation - continue diltiazem - stable currently   3. H/o PE  - pharmacy to dose coumadin -discussed with patient and patient's husband regarding risk with ongoing coumadin treatment. -will like to discuss with cardiology before making any decision. -with ongoing falls, will recommend to stop coumadin  4. CAD  - continue statin and heart healthy diet. -no CP.  5. Multiple falls  - complicating decision to continue anticoagulation  -PT recommending SNF if supervision/assistance 24/7 can not be provided - TSH WNL -vitamin b12 was only 143; can definitely contribute to loss of balance and weakness. -will replete  6. HLD: will continue statins.  7.B12 deficiency anemia: will start IM injection daily for repletion.   Code Status: Full Family Communication: husband at bedside Disposition Plan: to be determine   Consultants:  [facility] consulted (inputs on stopping coumadin given high risk for falls)  Procedures:  See below for x-ray reprots  Antibiotics:  none  HPI/Subjective: Afebrile, no acute distress. Patient complaining of being awfully weak.  Objective: Filed Vitals:   03/03/13 2054 03/04/13 0050 03/04/13 0230 03/04/13 0500  BP: 171/83 158/78 152/81 128/59  Pulse: 100 98 76 108  Temp: 97.6 F (36.4 C)  98.2 F (36.8 C) 98.5 F (36.9 C)  TempSrc: Oral   Oral  Resp: 18  20 19   SpO2: 96%   95%    Intake/Output Summary (Last 24 hours) at 03/04/13 1334 Last data filed at 03/04/13 0650  Gross per 24 hour  Intake    240 ml  Output      0 ml  Net    240 ml    Exam:   General:  Afebrile; no acute complaints; complaining of significant  weakness  Cardiovascular: rate controlled, some PVC's on exam; no rubs or gallops.  Respiratory: no wheezing, no crackles  Abdomen: soft, NT, ND, positive BS  Musculoskeletal: trace edema bilaterally  Data Reviewed: Basic Metabolic Panel:  Recent Labs Lab 03/03/13 1135 03/04/13 1010  NA 135 136  K 4.2 3.9  CL 99 98  CO2 27 28  GLUCOSE 129* 116*  BUN 15 15  CREATININE 0.73 0.66  CALCIUM 9.4 9.5   Liver Function Tests:  Recent Labs Lab 03/03/13 1135  AST 17  ALT 14  ALKPHOS 88  BILITOT 1.5*  PROT 6.3  ALBUMIN 3.4*   CBC:  Recent Labs Lab 03/03/13 1135 03/04/13 1010  WBC 11.0* 11.8*  NEUTROABS 7.9*  --   HGB 14.2 13.8  HCT 41.8 41.4  MCV 84.3 85.0  PLT 192 196    Studies: Dg Pelvis 1-2 Views  03/03/2013   *RADIOLOGY REPORT*  Clinical Data: Fall  PELVIS - 1-2 VIEW  Comparison: 02/01/2013  Findings: Single frontal view of the pelvis submitted. No acute fracture or subluxation.  Diffuse osteopenia.  Stable old compression fracture of the L4 vertebral body.  Degenerative changes pubic symphysis.  IMPRESSION: No acute fracture or subluxation.  Diffuse osteopenia.   Original Report Authenticated By: Natasha Mead, M.D.   Dg Femur Right  03/03/2013   *RADIOLOGY REPORT*  Clinical Data: Fall  RIGHT FEMUR - 2 VIEW  Comparison: None  Findings:  Four views of the right femur submitted.  No acute fracture or subluxation.  There is  diffuse osteopenia.  Degenerative changes pubic symphysis.  IMPRESSION: No acute fracture or subluxation.  Diffuse osteopenia.   Original Report Authenticated By: Natasha Mead, M.D.   Dg Tibia/fibula Right  03/03/2013   *RADIOLOGY REPORT*  Clinical Data: Fall, leg pain  RIGHT TIBIA AND FIBULA - 2 VIEW  Comparison: None.  Findings: Four views of the right tibia-fibula submitted.  There is mild displaced fracture proximal shaft of the right fibula. Diffuse osteopenia.  IMPRESSION: Mild displaced fracture of proximal right fibula.  Diffuse osteopenia.    Original Report Authenticated By: Natasha Mead, M.D.   Dg Ankle Complete Right  03/03/2013   *RADIOLOGY REPORT*  Clinical Data: Fall  RIGHT ANKLE - COMPLETE 3+ VIEW  Comparison: None.  Findings: Three views of the right ankle submitted.  There is old appearing fracture deformity in distal right fibula.  Ankle mortise is preserved.  No definite acute fracture or subluxation.  Plantar spur of the calcaneus. Diffuse osteopenia.  IMPRESSION: Old appearing fracture of distal right fibula.  Ankle mortise is preserved.  No definite acute fracture or subluxation.  Diffuse osteopenia.   Original Report Authenticated By: Natasha Mead, M.D.   Dg Knee Complete 4 Views Right  03/03/2013   *RADIOLOGY REPORT*  Clinical Data: Fall  RIGHT KNEE - COMPLETE 4+ VIEW  Comparison: None.  Findings: Four views of the right knee submitted.  No knee fracture or subluxation.  Narrowing of medial joint compartment.  Diffuse osteopenia.  Mild chondrocalcinosis.  Spurring lateral femoral condyle and lateral tibial plateau.  Mild displaced fracture proximal right fibular shaft.  Narrowing of patellofemoral joint space.  Spurring of patella.  IMPRESSION: No knee fracture or subluxation.  Narrowing of medial joint compartment.  Diffuse osteopenia.  Mild chondrocalcinosis. Spurring lateral femoral condyle and lateral tibial plateau.  Mild displaced fracture proximal right fibular shaft.   Original Report Authenticated By: Natasha Mead, M.D.    Scheduled Meds: . clorazepate  7.5 mg Oral QHS  . cyanocobalamin  1,000 mcg Intramuscular Daily  . diltiazem  120 mg Oral Daily  . famotidine  40 mg Oral Daily  . simvastatin  10 mg Oral q1800  . sodium chloride  3 mL Intravenous Q12H  . sodium chloride  3 mL Intravenous Q12H  . warfarin  4 mg Oral NOW  . Warfarin - Pharmacist Dosing Inpatient   Does not apply q1800   Continuous Infusions:   Active Problems:   HYPERLIPIDEMIA   CORONARY ARTERY DISEASE   PULMONARY EMBOLISM, HX OF   SUPRAVENTRICULAR  TACHYCARDIA, PAROXYSMAL, HX OF   Long term (current) use of anticoagulants   Fibula fracture   Near syncope   Multiple falls    Time spent: >30 minutes   Mosi Hannold  [facility] Pager [phone]. If 7PM-7AM, please contact night-coverage at [email or web address], password [facility] 03/04/2013, 1:34 PM  LOS: 1 day

## 2013-03-04 NOTE — Progress Notes (Signed)
Pt refused lab work this AM, stating she wanted her husband to be here first. Pt spoke to husband and he is on his way up here. Will contact phlebotomist once husband arrives to draw labs.

## 2013-03-04 NOTE — Evaluation (Signed)
Physical Therapy Evaluation Patient Details Name: [name] [name] MRN: [id number] DOB: September 23, 1923 Today's Date: 03/04/2013 Time: 4098-1191 PT Time Calculation (min): 30 min  PT Assessment / Plan / Recommendation Clinical Impression  Pt. was admitted with R fibular shaft fx from a mechanical fall.  Pt. has reportedly had multiple falls in the last few weeks and is having great difficulty with her functional mobility and gait currently.  Her husband is elderly and has been caring for her as best he can but cannot meet her needs currently.  She will need acute PT to address mobility and below issues and will need ST SNF for rehab before she can safely return home.      PT Assessment  Patient needs continued PT services    Follow Up Recommendations  SNF;Supervision/Assistance - 24 hour;Supervision for mobility/OOB    Does the patient have the potential to tolerate intense rehabilitation      Barriers to Discharge Decreased caregiver support      Equipment Recommendations  None recommended by PT    Recommendations for Other Services     Frequency Min 5X/week    Precautions / Restrictions Precautions Precautions: Fall Precaution Comments: pt has had multiple falls in recent weeks per pt's husband Restrictions Weight Bearing Restrictions: Yes RLE Weight Bearing: Weight bearing as tolerated   Pertinent Vitals/Pain See vitals tab       Mobility  Bed Mobility Bed Mobility: Rolling Left;Left Sidelying to Sit;Sitting - Scoot to Akins of Bed;Supine to Sit;Sit to Supine Rolling Left: 4: Min assist Left Sidelying to Sit: 1: +2 Total assist;With rails Left Sidelying to Sit: Patient Percentage: 40% Sitting - Scoot to Edge of Bed: 1: +2 Total assist Sitting - Scoot to Edge of Bed: Patient Percentage: 20% Sit to Supine: Not Tested (comment) Details for Bed Mobility Assistance: pt. needed max reassurance and encouragement to roll and move side to sit;  heavy reliance on PT staff and bed pad  for  transitional movements Transfers Transfers: Sit to Stand;Stand to Sit;Squat Pivot Transfers Sit to Stand: 1: +2 Total assist;From bed Sit to Stand: Patient Percentage: 30% Stand to Sit: 1: +2 Total assist;To bed Stand to Sit: Patient Percentage: 30% Squat Pivot Transfers: 1: +2 Total assist Squat Pivot Transfers: Patient Percentage: 30% Details for Transfer Assistance: P.t with difficulty in attempt to use RW to stand pivot, requiring +2 total assist without device for squat pivot trnasfer.  Needs max encouaragement to help as much as she can. Ambulation/Gait Ambulation/Gait Assistance: Not tested (comment)    Exercises     PT Diagnosis: Difficulty walking;Abnormality of gait  PT Problem List: Decreased strength;Decreased activity tolerance;Decreased balance;Decreased mobility;Decreased knowledge of use of DME;Obesity;Pain PT Treatment Interventions: DME instruction;Gait training;Functional mobility training;Therapeutic activities;Therapeutic exercise;Balance training;Patient/family education   PT Goals Acute Rehab PT Goals PT Goal Formulation: With patient Time For Goal Achievement: 03/11/13 Potential to Achieve Goals: Fair Pt will Roll Supine to Right Side: with supervision PT Goal: Rolling Supine to Right Side - Progress: Goal set today Pt will Roll Supine to Left Side: with supervision PT Goal: Rolling Supine to Left Side - Progress: Goal set today Pt will go Supine/Side to Sit: with supervision PT Goal: Supine/Side to Sit - Progress: Goal set today Pt will go Sit to Supine/Side: with supervision PT Goal: Sit to Supine/Side - Progress: Goal set today Pt will go Sit to Stand: with min assist PT Goal: Sit to Stand - Progress: Goal set today Pt will go Stand to Sit: with  min assist PT Goal: Stand to Sit - Progress: Goal set today Pt will Transfer Bed to Chair/Chair to Bed: with min assist PT Transfer Goal: Bed to Chair/Chair to Bed - Progress: Goal set today Pt will  Ambulate: 16 - 50 feet;with mod assist;with rolling walker PT Goal: Ambulate - Progress: Goal set today  Visit Information  Last PT Received On: 03/04/13 Assistance Needed: +2    Subjective Data  Subjective: Pt. reports feeling sad about her situation, crying at times Patient Stated Goal: pt. did not state; husband Chase Picket states he wants pt. to go for rehab before home due to increasing difficulty in taking care of her   Prior Functioning  Home Living Lives With: Spouse Available Help at Discharge: Family;Available 24 hours/day Type of Home: House Home Access: Stairs to enter [employer] of Steps: 3 Entrance Stairs-Rails: Left Home Layout: One level Bathroom Shower/Tub: Tub/shower unit;Door [employer] Toilet: Standard Home Adaptive Equipment: Programmer, systems - four wheeled;Bedside commode/3-in-1;Shower chair with back Prior Function Level of Independence: Needs assistance Needs Assistance: Gait;Transfers;Bathing;Dressing;Toileting Bath: Minimal Dressing: Minimal Toileting: Minimal Gait Assistance: min Transfer Assistance: min Able to Take Stairs?: No Driving: No Vocation: Retired Musician: No difficulties    Copywriter, advertising Arousal/Alertness: Awake/alert Behavior During Therapy: WFL for tasks assessed/performed Overall Cognitive Status: Within Functional Limits for tasks assessed    Extremity/Trunk Assessment Right Upper Extremity Assessment RUE ROM/Strength/Tone: [facility] for tasks assessed Left Upper Extremity Assessment LUE ROM/Strength/Tone: WFL for tasks assessed Right Lower Extremity Assessment RLE ROM/Strength/Tone: Deficits;Unable to fully assess;Due to pain RLE ROM/Strength/Tone Deficits: good quad set; unable to detect a quad set but due to pt. with trouble understanding RLE Sensation: WFL - Light Touch Left Lower Extremity Assessment LLE ROM/Strength/Tone: WFL for tasks assessed LLE Sensation: WFL - Light Touch Trunk  Assessment Trunk Assessment: Kyphotic   Balance Balance Balance Assessed: Yes Static Sitting Balance Static Sitting - Balance Support: Bilateral upper extremity supported;Feet supported Static Sitting - Level of Assistance: 5: Stand by assistance  End of Session PT - End of Session Equipment Utilized During Treatment: Gait belt Activity Tolerance: Patient limited by fatigue;Patient limited by pain Patient left: in chair;with call bell/phone within reach;with family/visitor present Nurse Communication: Mobility status;Other (comment) (need for 2 assist posted on board, transfers only)  GP     Ferman Hamming 03/04/2013, 12:36 PM Weldon Picking PT Acute Rehab Services [phone] Beeper [phone]

## 2013-03-04 NOTE — Progress Notes (Signed)
Admit Complaint: 79 y.o female admitted 03/03/13 with right fibular shaft fracture after sustaining a mechanical fall. Orthopedic MD recommended therapy; No splint or boot and no surgery recommended by ortho.     Anticoagulation-chronic coumadin PTA for h/o PE, SVT PTA dose : 2.5mg  daily , last taken 6/3 Ortho MD did NOT rec'd surgery; discussed w/Dr. Chryl Heck to start coumadin now.  INR 1.54 (1.64 yesterday);  No bleeding,  Hgb 13.8, pltc 196K  Plan: Coumadin 4mg  x1 now (none @ 6pm tonight) INR qAM F/u pltc   Infectious Disease- afeb, wbc 11.8 Cardiovascular- diltiazem , zocor, coumadin Endocrinology Gastrointestinal / Nutrition- pepcid Neurology- tranxene Nephrology Pulmonary Hematology / Onc- H/H 13.8/41.4, pltc 196k PTA Medication Issues- resumed Best Practices-coumadin

## 2013-03-04 NOTE — Progress Notes (Signed)
INITIAL NUTRITION ASSESSMENT  DOCUMENTATION CODES Per approved criteria  -Obesity Unspecified   INTERVENTION: 1.  Supplements; Ensure Complete po once daily, each supplement provides 350 kcal and 13 grams of protein.   NUTRITION DIAGNOSIS: Increased nutrient needs related to healing as evidenced by pt with fx.   Monitor:  1.  Food/Beverage; improvement in PO intake, sufficient to meet >/=90% of estimated needs  Reason for Assessment: consult; assessment  79 y.o. female  Admitting Dx: fibula fx  ASSESSMENT: Pt admitted with fibula fx s/p fall. RD met wit pt and husband at bedside.  Pt is still somewhat confused.  Husband reports stable wt and good intake PTA.  He reports pt has been eating well since surgery- pt did have meal overlap today with breakfast and lunch due to waking up late.   RD recommended Ensure for the next 2-4 weeks to ensure adequate protein intake for healing.  Pt and husband agreeable.  RD to order once daily.  Height: Ht Readings from Last 1 Encounters:  02/26/13 5\' 1"  (1.549 m)    Weight: Wt Readings from Last 1 Encounters:  02/26/13 192 lb (87.091 kg)    Ideal Body Weight: 105 lbs  % Ideal Body Weight: 182%  Wt Readings from Last 10 Encounters:  02/26/13 192 lb (87.091 kg)  02/10/13 192 lb (87.091 kg)  04/07/12 199 lb 2 oz (90.323 kg)  02/27/10 207 lb (93.895 kg)  12/26/09 210 lb (95.255 kg)  03/03/08 187 lb 9.6 oz (85.095 kg)    Usual Body Weight: 190 lbs  % Usual Body Weight: 100%  BMI:  There is no weight on file to calculate BMI.  Estimated Nutritional Needs: Kcal: 1430-1620 Protein: 57-66g Fluid: >1.5 L/day  Skin: intacy  Diet Order: Cardiac  EDUCATION NEEDS: -Education needs addressed   Intake/Output Summary (Last 24 hours) at 03/04/13 1410 Last data filed at 03/04/13 0650  Gross per 24 hour  Intake    240 ml  Output      0 ml  Net    240 ml    Last BM: 6/1  Labs:   Recent Labs Lab 03/03/13 1135  03/04/13 1010  NA 135 136  K 4.2 3.9  CL 99 98  CO2 27 28  BUN 15 15  CREATININE 0.73 0.66  CALCIUM 9.4 9.5  GLUCOSE 129* 116*    CBG (last 3)  No results found for this basename: GLUCAP,  in the last 72 hours  Scheduled Meds: . clorazepate  7.5 mg Oral QHS  . cyanocobalamin  1,000 mcg Intramuscular Daily  . diltiazem  120 mg Oral Daily  . famotidine  40 mg Oral Daily  . simvastatin  10 mg Oral q1800  . sodium chloride  3 mL Intravenous Q12H  . sodium chloride  3 mL Intravenous Q12H  . warfarin  4 mg Oral NOW  . Warfarin - Pharmacist Dosing Inpatient   Does not apply q1800    Continuous Infusions:   Past Medical History  Diagnosis Date  . B12 deficiency   . Myocardial infarct   . DDD (degenerative disc disease)   . Supraventricular tachycardia   . Iron deficiency anemia   . HLD (hyperlipidemia)   . Osteoporosis   . Pulmonary embolism   . CAD (coronary artery disease)   . Choledocholithiasis   . History of pancreatitis   . Diverticulosis   . Hiatal hernia   . Hx of adenomatous colonic polyps     Past Surgical History  Procedure  Laterality Date  . Kyphosis surgery    . Vertebroplasty    . Laminectomy    . Cholecystectomy    . Cardiac catheterization    . Ercp with sphincterotomy      Loyce Dys, MS RD LDN Clinical Inpatient Dietitian Pager: [phone] Weekend/After hours pager: [phone]

## 2013-03-04 NOTE — Consult Note (Addendum)
Reason for Consult: Coumadin Rx  Requesting Physician: [facility]  HPI: This is a 79 y.o. female with a past medical history significant for CAD, PE, DVT, and MAT. She was previously followed by Dr Clarene Duke and is now followed by Dr Royann Shivers. She has a history of CAD nad had an LAD stent in '96. In 8/04 she had a PTCA to her OM. In Nov 2005 she had a DES placed to her LAD. She was last studied in Jan 2007 and these sites were patent. In @007  she had multiple medical issues and hospitalizations. In Jan of that year she had chest pain and work up revealed DVT and PE. She was put on Coumadin. In March she had cholecystitis and pancreatitis treated with a percutaneous drain and ultimatkly laparoscopic cholecystectomy in July 2007.  She had L1 and L4  kyphoplasty in Oct 2007. She has had an EP consult remotley for MAT and at one point was on Amiodarone but her PFTs demonstrated  diffusing capacity  mismatch and Amiodarone was discontinued. She has been fairly stable from a cardiac standpoint. Her LOV was 12/13. She has remained on Coumadin. Over the past few months her husband notes increasing weakness and multiple falls. She was admitted 03/03/13 when it was noted she couldn't put any weight on her Rt leg. XR revealed fractured Rt fibula. The plan is for medical Rx. The family wanted our input regarding discontinuation of Coumadin Rx.  PMHx:  Past Medical History  Diagnosis Date  . B12 deficiency   . Myocardial infarct   . DDD (degenerative disc disease)   . Supraventricular tachycardia   . Iron deficiency anemia   . HLD (hyperlipidemia)   . Osteoporosis   . Pulmonary embolism   . CAD (coronary artery disease)   . Choledocholithiasis   . History of pancreatitis   . Diverticulosis   . Hiatal hernia   . Hx of adenomatous colonic polyps    Past Surgical History  Procedure Laterality Date  . Kyphosis surgery    . Vertebroplasty  2007  . Laminectomy    . Cholecystectomy  2007  . Cardiac  catheterization  '96, 8/04,11/05, 1/07  . Ercp with sphincterotomy  2007   FAMHx: Family History  Problem Relation Age of Onset  . Stroke Father   . Stroke Brother   . Diabetes Son   . Colon cancer Neg Hx    SOCHx:  reports that she has never smoked. She has never used smokeless tobacco. She reports that she does not drink alcohol or use illicit drugs.  ALLERGIES: No Known Allergies  ROS: Pertinent items are noted in HPI. No chest pain, no palpitations. Good LVF by 2D in '07. Chronic LE edema Chronic back pain Other ROS per H&P from 03/03/13  HOME MEDICATIONS: Prescriptions prior to admission  Medication Sig Dispense Refill  . albuterol (PROVENTIL HFA;VENTOLIN HFA) 108 (90 BASE) MCG/ACT inhaler Inhale 2 puffs into the lungs every 6 (six) hours as needed for wheezing.      . bumetanide (BUMEX) 1 MG tablet Take 0.5-1 mg by mouth daily as needed (swelling). Take 1/2-1 tablet by mouth every morning prn      . Cholecalciferol (VITAMIN D-3 PO) Take 2,000 mg by mouth daily.       . clorazepate (TRANXENE) 7.5 MG tablet Take 7.5 mg by mouth at bedtime.      Marland Kitchen diltiazem (CARDIZEM) 120 MG tablet Take 120 mg by mouth daily.      . famotidine (PEPCID)  40 MG tablet Take 1 tablet (40 mg total) by mouth daily.  90 tablet  3  . HYDROcodone-acetaminophen (NORCO/VICODIN) 5-325 MG per tablet Take 1 tablet by mouth every 8 (eight) hours as needed for pain.      Marland Kitchen MAGNESIUM PO Take 1 tablet by mouth daily.      . pravastatin (PRAVACHOL) 40 MG tablet Take 40 mg by mouth daily.      Marland Kitchen warfarin (COUMADIN) 2.5 MG tablet Take 2.5 mg by mouth at bedtime.        HOSPITAL MEDICATIONS: I have reviewed the patient's current medications.  VITALS: Blood pressure 105/59, pulse 74, temperature 97.6 F (36.4 C), temperature source Oral, resp. rate 20, SpO2 94.00%.  PHYSICAL EXAM: General appearance: cooperative, appears stated age, no distress and moderately obese Neck: no carotid bruit and no JVD Lungs:  clear to auscultation bilaterally and decreased breath sounds at bases Heart: regular rate and rhythm, nl S1&S2, no M/R/R Abdomen: obese,spft, nt, nd, nabs Extremities: 2+ bilat edema, bilat LE echymosis Pulses: diminished, but palpable Skin: pale, cool, dry Neurologic: Grossly normal  LABS: Results for orders placed during the hospital encounter of 03/03/13 (from the past 48 hour(s))  CBC WITH DIFFERENTIAL     Status: Abnormal   Collection Time    03/03/13 11:35 AM      Result Value Range   WBC 11.0 (*) 4.0 - 10.5 K/uL   RBC 4.96  3.87 - 5.11 MIL/uL   Hemoglobin 14.2  12.0 - 15.0 g/dL   HCT 40.9  81.1 - 91.4 %   MCV 84.3  78.0 - 100.0 fL   MCH 28.6  26.0 - 34.0 pg   MCHC 34.0  30.0 - 36.0 g/dL   RDW 78.2  95.6 - 21.3 %   Platelets 192  150 - 400 K/uL   Neutrophils Relative % 72  43 - 77 %   Neutro Abs 7.9 (*) 1.7 - 7.7 K/uL   Lymphocytes Relative 11 (*) 12 - 46 %   Lymphs Abs 1.2  0.7 - 4.0 K/uL   Monocytes Relative 17 (*) 3 - 12 %   Monocytes Absolute 1.9 (*) 0.1 - 1.0 K/uL   Eosinophils Relative 0  0 - 5 %   Eosinophils Absolute 0.0  0.0 - 0.7 K/uL   Basophils Relative 0  0 - 1 %   Basophils Absolute 0.0  0.0 - 0.1 K/uL  COMPREHENSIVE METABOLIC PANEL     Status: Abnormal   Collection Time    03/03/13 11:35 AM      Result Value Range   Sodium 135  135 - 145 mEq/L   Potassium 4.2  3.5 - 5.1 mEq/L   Chloride 99  96 - 112 mEq/L   CO2 27  19 - 32 mEq/L   Glucose, Bld 129 (*) 70 - 99 mg/dL   BUN 15  6 - 23 mg/dL   Creatinine, Ser 0.86  0.50 - 1.10 mg/dL   Calcium 9.4  8.4 - 57.8 mg/dL   Total Protein 6.3  6.0 - 8.3 g/dL   Albumin 3.4 (*) 3.5 - 5.2 g/dL   AST 17  0 - 37 U/L   ALT 14  0 - 35 U/L   Alkaline Phosphatase 88  39 - 117 U/L   Total Bilirubin 1.5 (*) 0.3 - 1.2 mg/dL   GFR calc non Af Amer 75 (*) >90 mL/min   GFR calc Af Amer 87 (*) >90 mL/min   Comment:  The eGFR has been calculated     using the CKD EPI equation.     This calculation has not  been     validated in all clinical     situations.     eGFR's persistently     <90 mL/min signify     possible Chronic Kidney Disease.  PROTIME-INR     Status: Abnormal   Collection Time    03/03/13 11:35 AM      Result Value Range   Prothrombin Time 18.9 (*) 11.6 - 15.2 seconds   INR 1.64 (*) 0.00 - 1.49  URINALYSIS, ROUTINE W REFLEX MICROSCOPIC     Status: Abnormal   Collection Time    03/03/13 12:29 PM      Result Value Range   Color, Urine YELLOW  YELLOW   APPearance CLEAR  CLEAR   Specific Gravity, Urine 1.022  1.005 - 1.030   pH 7.0  5.0 - 8.0   Glucose, UA NEGATIVE  NEGATIVE mg/dL   Hgb urine dipstick NEGATIVE  NEGATIVE   Bilirubin Urine SMALL (*) NEGATIVE   Ketones, ur 15 (*) NEGATIVE mg/dL   Protein, ur NEGATIVE  NEGATIVE mg/dL   Urobilinogen, UA 4.0 (*) 0.0 - 1.0 mg/dL   Nitrite NEGATIVE  NEGATIVE   Leukocytes, UA NEGATIVE  NEGATIVE   Comment: MICROSCOPIC NOT DONE ON URINES WITH NEGATIVE PROTEIN, BLOOD, LEUKOCYTES, NITRITE, OR GLUCOSE <1000 mg/dL.  VITAMIN B12     Status: Abnormal   Collection Time    03/03/13  8:55 PM      Result Value Range   Vitamin B-12 143 (*) 211 - 911 pg/mL  FOLATE     Status: None   Collection Time    03/03/13  8:55 PM      Result Value Range   Folate 16.2     Comment: (NOTE)     Reference Ranges            Deficient:       0.4 - 3.3 ng/mL            Indeterminate:   3.4 - 5.4 ng/mL            Normal:              > 5.4 ng/mL  TSH     Status: None   Collection Time    03/03/13  8:55 PM      Result Value Range   TSH 1.494  0.350 - 4.500 uIU/mL  BASIC METABOLIC PANEL     Status: Abnormal   Collection Time    03/04/13 10:10 AM      Result Value Range   Sodium 136  135 - 145 mEq/L   Potassium 3.9  3.5 - 5.1 mEq/L   Chloride 98  96 - 112 mEq/L   CO2 28  19 - 32 mEq/L   Glucose, Bld 116 (*) 70 - 99 mg/dL   BUN 15  6 - 23 mg/dL   Creatinine, Ser 4.54  0.50 - 1.10 mg/dL   Calcium 9.5  8.4 - 09.8 mg/dL   GFR calc non Af Amer 77  (*) >90 mL/min   GFR calc Af Amer 89 (*) >90 mL/min   Comment:            The eGFR has been calculated     using the CKD EPI equation.     This calculation has not been     validated in all clinical  situations.     eGFR's persistently     <90 mL/min signify     possible Chronic Kidney Disease.  CBC     Status: Abnormal   Collection Time    03/04/13 10:10 AM      Result Value Range   WBC 11.8 (*) 4.0 - 10.5 K/uL   RBC 4.87  3.87 - 5.11 MIL/uL   Hemoglobin 13.8  12.0 - 15.0 g/dL   HCT 98.1  19.1 - 47.8 %   MCV 85.0  78.0 - 100.0 fL   MCH 28.3  26.0 - 34.0 pg   MCHC 33.3  30.0 - 36.0 g/dL   RDW 29.5 (*) 62.1 - 30.8 %   Platelets 196  150 - 400 K/uL  PROTIME-INR     Status: Abnormal   Collection Time    03/04/13 10:10 AM      Result Value Range   Prothrombin Time 18.0 (*) 11.6 - 15.2 seconds   INR 1.54 (*) 0.00 - 1.49    EKG: NSR, Q in V2, low voltage  IMAGING: Dg Tibia/fibula Right  03/03/2013   *RADIOLOGY REPORT*  Clinical Data: Fall, leg pain  RIGHT TIBIA AND FIBULA - 2 VIEW  .  IMPRESSION: Mild displaced fracture of proximal right fibula.  Diffuse osteopenia.   Original Report Authenticated By: Natasha Mead, M.D.   IMPRESSION: Active Problems:   Near syncope   Multiple falls   CAD, LAD stent '96, 11/05 (DES).  OM PCTA 8/04. Last cath 1/07 OK   PE and DVT in 2007   SUPRAVENTRICULAR TACHYCARDIA, PAROXYSMAL, HX OF   Long term (current) use of anticoagulants   Fibula fracture on admission 03/03/13   HYPERLIPIDEMIA  RECOMMENDATION: I suspect she is too high risk for Coumadin, will discuss with Dr Erin Hearing partner. Hold Coumadin today till seen by MD.  Time Spent Directly with Patient: 45 minutes  Abelino Derrick [phone] beeper 03/04/2013, 3:39 PM   I have seen and evaluated the patient this PM along with Corine Shelter, PA. I agree with his findings, examination as well as impression recommendations.  Very pleasant elderly woman with prior h/o CAD-PCI & PAF who has  been on Warfarin for CVA prevention.  She has had several falls recently & now admitted for fractured fibula.    She denies any Sx of PND, orhtopnea, edema, or angina.  No sensation of rapid HR.   I do agree that she is likely a bit too high risk for continuing long-term AC.  The caveat to this is that she will need prophylactic anticoagulation for VTE while recuperating from her fall & fracture. --> I will defer the final decision to Dr. Royann Shivers, who will be here in the hospital tomorrow.  She indicates that she is tired of taking so many medications.  Unfortunately, she was quite sleepy during my interview and was not able to provide much information.  MD Time with pt: 10 min  HARDING,DAVID W, M.D., M.S. [facility] [address] [place], [place]  [address]  [phone] Pager # [phone] 03/04/2013 5:18 PM

## 2013-03-04 NOTE — Progress Notes (Signed)
Pt arrived to unit via stretcher at 2030. Transferred pt into bed, elevated R leg and applied ice. Alert, oriented to self, place and situation, disoriented to time. Per ED report pt was beginning to get slightly "loopy". Family at bedside, explained plan of care to pt and family. Once family left pt began to state she was "afraid to be here" and began to get slightly confused being alone in the room. Husband was called and reassured pt would be fine and he would return first thing in the morning. Spent great deal of time in room to comfort pt, reorient her to situation and place, and she is currently resting comfortably. Will continue to monitor.

## 2013-03-04 NOTE — Progress Notes (Signed)
ANTICOAGULATION CONSULT NOTE - Initial Consult  Pharmacy Consult for Coumadin Indication: history of PE and SVT (on coumadin PTA)  No Known Allergies  Patient Measurements:     Vital Signs: Temp: 98.5 F (36.9 C) (06/05 0500) Temp src: Oral (06/05 0500) BP: 128/59 mmHg (06/05 0500) Pulse Rate: 108 (06/05 0500)  Labs:  Recent Labs  03/03/13 1135 03/04/13 1010  HGB 14.2 13.8  HCT 41.8 41.4  PLT 192 196  LABPROT 18.9* 18.0*  INR 1.64* 1.54*  CREATININE 0.73 0.66    The CrCl is unknown because both a height and weight (above a minimum accepted value) are required for this calculation.   Medical History: Past Medical History  Diagnosis Date  . B12 deficiency   . Myocardial infarct   . DDD (degenerative disc disease)   . Supraventricular tachycardia   . Iron deficiency anemia   . HLD (hyperlipidemia)   . Osteoporosis   . Pulmonary embolism   . CAD (coronary artery disease)   . Choledocholithiasis   . History of pancreatitis   . Diverticulosis   . Hiatal hernia   . Hx of adenomatous colonic polyps     Medications:  Prescriptions prior to admission  Medication Sig Dispense Refill  . albuterol (PROVENTIL HFA;VENTOLIN HFA) 108 (90 BASE) MCG/ACT inhaler Inhale 2 puffs into the lungs every 6 (six) hours as needed for wheezing.      . bumetanide (BUMEX) 1 MG tablet Take 0.5-1 mg by mouth daily as needed (swelling). Take 1/2-1 tablet by mouth every morning prn      . Cholecalciferol (VITAMIN D-3 PO) Take 2,000 mg by mouth daily.       . clorazepate (TRANXENE) 7.5 MG tablet Take 7.5 mg by mouth at bedtime.      Marland Kitchen diltiazem (CARDIZEM) 120 MG tablet Take 120 mg by mouth daily.      . famotidine (PEPCID) 40 MG tablet Take 1 tablet (40 mg total) by mouth daily.  90 tablet  3  . HYDROcodone-acetaminophen (NORCO/VICODIN) 5-325 MG per tablet Take 1 tablet by mouth every 8 (eight) hours as needed for pain.      Marland Kitchen MAGNESIUM PO Take 1 tablet by mouth daily.      . pravastatin  (PRAVACHOL) 40 MG tablet Take 40 mg by mouth daily.      Marland Kitchen warfarin (COUMADIN) 2.5 MG tablet Take 2.5 mg by mouth at bedtime.        Scheduled:  . clorazepate  7.5 mg Oral QHS  . diltiazem  120 mg Oral Daily  . famotidine  40 mg Oral Daily  . simvastatin  10 mg Oral q1800  . sodium chloride  3 mL Intravenous Q12H  . sodium chloride  3 mL Intravenous Q12H    Assessment: [name] [name] admitted 03/03/13 with right fibular shaft fracture after sustaining a mechanical fall.  Orthopedic MD recommended therapy; No splint or boot and no surgery recommended by ortho.   The patient was on chronic coumadin PTA for h/o PE and SVT.  Her PTA dose was 2.5mg  daily , last taken on 03/02/13.  Admit INR = 1.64.  This AM the INR = 1.54.   Hgb 13.8, pltc 196K No bleeding noted.  Goal of Therapy:  INR 2-3 Monitor platelets by anticoagulation protocol: Yes   Plan:  Will resume coumadin now with 4 mg dose since no surgery planned.  Daily INR/ Protime.    Noah Delaine, RPh Clinical Pharmacist Pager: [phone]  03/04/2013,12:56 PM

## 2013-03-05 ENCOUNTER — Ambulatory Visit: Payer: Medicare Other | Admitting: Pharmacist Clinician (PhC)/ Clinical Pharmacy Specialist

## 2013-03-05 DIAGNOSIS — I10 Essential (primary) hypertension: Secondary | ICD-10-CM

## 2013-03-05 DIAGNOSIS — E538 Deficiency of other specified B group vitamins: Secondary | ICD-10-CM

## 2013-03-05 LAB — CORTISOL-AM, BLOOD: Cortisol - AM: 31.1 ug/dL — ABNORMAL HIGH (ref 4.3–22.4)

## 2013-03-05 LAB — BASIC METABOLIC PANEL
BUN: 23 mg/dL (ref 6–23)
Calcium: 9.1 mg/dL (ref 8.4–10.5)
Creatinine, Ser: 0.81 mg/dL (ref 0.50–1.10)
GFR calc Af Amer: 74 mL/min — ABNORMAL LOW (ref >90)

## 2013-03-05 LAB — CBC
HCT: 37.4 % (ref 36.0–46.0)
MCH: 28.2 pg (ref 26.0–34.0)
MCV: 85.2 fL (ref 78.0–100.0)
Platelets: 226 10*3/uL (ref 150–400)
RDW: 15.8 % — ABNORMAL HIGH (ref 11.5–15.5)

## 2013-03-05 MED ORDER — PNEUMOCOCCAL VAC POLYVALENT 25 MCG/0.5ML IJ INJ
0.5000 mL | INJECTION | INTRAMUSCULAR | Status: AC
  Administered 2013-03-06: 0.5 mL via INTRAMUSCULAR
  Filled 2013-03-05: qty 0.5

## 2013-03-05 MED ORDER — HYDROCODONE-ACETAMINOPHEN 5-325 MG PO TABS: 1.0000 | ORAL_TABLET | Freq: Three times a day (TID) | ORAL | Status: DC | PRN

## 2013-03-05 MED ORDER — ENSURE COMPLETE PO LIQD: 237.0000 mL | ORAL | Status: DC

## 2013-03-05 MED ORDER — CYANOCOBALAMIN 1000 MCG/ML IJ SOLN: INTRAMUSCULAR | Status: DC

## 2013-03-05 MED ORDER — WARFARIN SODIUM 4 MG PO TABS
4.0000 mg | ORAL_TABLET | Freq: Once | ORAL | Status: AC
  Administered 2013-03-05: 4 mg via ORAL
  Filled 2013-03-05: qty 1

## 2013-03-05 MED ORDER — CLORAZEPATE DIPOTASSIUM 7.5 MG PO TABS: 7.5000 mg | ORAL_TABLET | Freq: Every day | ORAL | Status: DC

## 2013-03-05 NOTE — Progress Notes (Signed)
Physical Therapy Treatment Patient Details Name: [name] [name] MRN: [id number] DOB: [birth year] Today's Date: 03/05/2013 Time: 1478-2956 PT Time Calculation (min): 24 min  PT Assessment / Plan / Recommendation Comments on Treatment Session  Pt. still with significant difficulty with mobility and continues to need 2 for assist in transfers.  She has not been able to ambulate yet.  Needs SNF for rehab post acute.    Follow Up Recommendations  SNF;Supervision/Assistance - 24 hour;Supervision for mobility/OOB     Does the patient have the potential to tolerate intense rehabilitation     Barriers to Discharge        Equipment Recommendations  None recommended by PT    Recommendations for Other Services    Frequency Min 5X/week   Plan Discharge plan remains appropriate;Frequency remains appropriate    Precautions / Restrictions Precautions Precautions: Fall Precaution Comments: pt has had multiple falls in recent weeks per pt's husband Restrictions Weight Bearing Restrictions: Yes RLE Weight Bearing: Weight bearing as tolerated   Pertinent Vitals/Pain See vitals tab     Mobility  Bed Mobility Bed Mobility: Not assessed (pt. up in recliner chair) Transfers Transfers: Sit to Stand;Stand to Sit;Stand Pivot Transfers Sit to Stand: 1: +2 Total assist;From chair/3-in-1 Sit to Stand: Patient Percentage: 40% Stand to Sit: 1: +2 Total assist;To chair/3-in-1 Stand to Sit: Patient Percentage: 40% Stand Pivot Transfers: 1: +2 Total assist Stand Pivot Transfers: Patient Percentage: 40% Details for Transfer Assistance: Pt. with difficulty in transferring weight to either side to make steps.  Needed 2  total assist to rise to stand and control descent as welll as to shift hips for pivot to 3n1 then back to recliner chair.  Pt. able to assist minimally.  Pt. + for urinationaand BM Ambulation/Gait Ambulation/Gait Assistance: Not tested (comment) (pt. unable)    Exercises     PT Diagnosis:     PT Problem List:   PT Treatment Interventions:     PT Goals Acute Rehab PT Goals Pt will go Sit to Stand: with min assist PT Goal: Sit to Stand - Progress: Progressing toward goal Pt will go Stand to Sit: with min assist PT Goal: Stand to Sit - Progress: Progressing toward goal Pt will Transfer Bed to Chair/Chair to Bed: with min assist PT Transfer Goal: Bed to Chair/Chair to Bed - Progress: Progressing toward goal  Visit Information  Last PT Received On: 03/05/13 Assistance Needed: +2    Subjective Data  Subjective: Pt. appears brighter than yesterday   Cognition  Cognition Arousal/Alertness: Awake/alert Behavior During Therapy: WFL for tasks assessed/performed Overall Cognitive Status: Within Functional Limits for tasks assessed    Balance     End of Session PT - End of Session Equipment Utilized During Treatment: Gait belt Activity Tolerance: Patient limited by fatigue;Patient limited by pain Patient left: in chair;with call bell/phone within reach;with family/visitor present Nurse Communication: Mobility status   GP     Ferman Hamming 03/05/2013, 1:44 PM Weldon Picking PT Acute Rehab Services [phone] Beeper [phone]

## 2013-03-05 NOTE — Progress Notes (Signed)
ANTICOAGULATION CONSULT NOTE - Follow Up Consult  Pharmacy Consult:  Coumadin Indication:  History of PE / PAF  No Known Allergies  Patient Measurements: Height: 5' 1.02" (155 cm) Weight: 192 lb 0.3 oz (87.1 kg) IBW/kg (Calculated) : 47.86  Labs:  Recent Labs  03/03/13 1135 03/04/13 1010 03/05/13 0535  HGB 14.2 13.8 12.4  HCT 41.8 41.4 37.4  PLT 192 196 226  LABPROT 18.9* 18.0* 17.5*  INR 1.64* 1.54* 1.48  CREATININE 0.73 0.66 0.81    Estimated Creatinine Clearance: 49.1 ml/min (by C-G formula based on Cr of 0.81).      Assessment: 79 y.o female admitted 03/03/13 with right fibular shaft fracture after sustaining a mechanical fall.  Without plans for surgery, Pharmacy consulted to resume Coumadin for history of PE and PAF.  Yesterday's dose was not charged as given.  INR currently sub-therapeutic.  No bleeding reported.   Goal of Therapy:  INR 2-3 Monitor platelets by anticoagulation protocol: Yes    Plan:  - Coumadin 4mg  PO today - Daily PT / INR     Ruthanna Macchia D. Laney Potash, PharmD, BCPS Pager:  [phone] 03/05/2013, 2:17 PM

## 2013-03-05 NOTE — Clinical Social Work Placement (Addendum)
Clinical Social Work Department  CLINICAL SOCIAL WORK PLACEMENT NOTE  03/05/2013  Patient: [name] [name]  Account Number:  [id number] Admit date: 03/04/13 Clinical Social Worker: Sabino Niemann LCSWA Date/time: 03/05/2013  3:30 PM  Clinical Social Work is seeking post-discharge placement for this patient at the following level of care: SKILLED NURSING (*CSW will update this form in Epic as items are completed)  03/05/2013 Patient/family provided with [facility] Department of Clinical Social Work's list of facilities offering this level of care within the geographic area requested by the patient (or if unable, by the patient's family).  6/6/2014Patient/family informed of their freedom to choose among providers that offer the needed level of care, that participate in Medicare, Medicaid or managed care program needed by the patient, have an available bed and are willing to accept the patient.  6/6/2014Patient/family informed of [facility]' ownership interest in [facility], as well as of the fact that they are under no obligation to receive care at this facility.  PASARR submitted to EDS on 03/05/2013 PASARR number received from EDS on  03/05/2013 FL2 transmitted to all facilities in geographic area requested by pt/family on 03/05/2013  FL2 transmitted to all facilities within larger geographic area on  Patient informed that his/her managed care company has contracts with or will negotiate with certain facilities, including the following:  Patient/family informed of bed offers received:03/05/2013   Patient chooses bed at [facility] Physician recommends and patient chooses bed at  Patient to be transferred to on 03/06/13 Patient to be transferred to facility by ptar The following physician request were entered in Epic:  Additional Comments: Husband has signed all paperwork

## 2013-03-05 NOTE — Progress Notes (Signed)
Subjective: Sitting up in chair eating.  No complaints, no chest pain.  Objective: Vital signs in last 24 hours: Temp:  [97.6 F (36.4 C)-97.8 F (36.6 C)] 97.8 F (36.6 C) (06/05 2100) Pulse Rate:  [68-74] 68 (06/05 2100) Resp:  [19-20] 19 (06/05 2100) BP: (105-115)/(59-62) 115/62 mmHg (06/05 2100) SpO2:  [94 %-95 %] 95 % (06/05 2100) Weight change:  Last BM Date: 03/04/13 Intake/Output from previous day: +120 06/05 0701 - 06/06 0700 In: 120 [P.O.:120] Out: -  Intake/Output this shift: Total I/O In: 3 [I.V.:3] Out: -   ZO:XWRUEAV:WUJWJXBJ affect, NAD Neck:supple, no JVD sitting up in chair  Heart:S1S2 RRR without murmur, gallup, rub or click Lungs:clear without rales, rhonchi, or wheezes YNW:GNFA, non tender, + BS, do not palpate liver spleen or masses Ext:tr to 1+  lower ext edema, + bruising Large area on Lt leg, and smaller area on Rt leg. Neuro:alert and oriented, MAE, follows commands  TELE:  SR occ PVCs.  Lab Results:  Recent Labs  03/04/13 1010 03/05/13 0535  WBC 11.8* 10.8*  HGB 13.8 12.4  HCT 41.4 37.4  PLT 196 226   BMET  Recent Labs  03/04/13 1010 03/05/13 0535  NA 136 135  K 3.9 4.1  CL 98 98  CO2 28 28  GLUCOSE 116* 113*  BUN 15 23  CREATININE 0.66 0.81  CALCIUM 9.5 9.1   No results found for this basename: TROPONINI, CK, MB,  in the last 72 hours  No results found for this basename: CHOL, HDL, LDLCALC, LDLDIRECT, TRIG, CHOLHDL   No results found for this basename: HGBA1C     Lab Results  Component Value Date   TSH 1.494 03/03/2013    Hepatic Function Panel  Recent Labs  03/03/13 1135  PROT 6.3  ALBUMIN 3.4*  AST 17  ALT 14  ALKPHOS 88  BILITOT 1.5*   No results found for this basename: CHOL,  in the last 72 hours No results found for this basename: PROTIME,  in the last 72 hours      Studies/Results: Ct Head Wo Contrast  03/04/2013   *RADIOLOGY REPORT*  Clinical Data: Altered mental status.  History of falls.   On Coumadin.  CT HEAD WITHOUT CONTRAST  Technique:  Contiguous axial images were obtained from the base of the skull through the vertex without contrast.  Comparison: None.  Findings: Diffusely enlarged ventricles and subarachnoid spaces. Small amount of periventricular white matter low density in both cerebral hemispheres.  No intracranial hemorrhage, mass lesion or CT evidence of acute infarction.  Bilateral sphenoid sinus mucosal thickening.  IMPRESSION:  1.  No acute abnormality. 2.  Moderate atrophy and minimal chronic small vessel white matter ischemic changes. 3.  Chronic bilateral sphenoid sinusitis.   Original Report Authenticated By: Beckie Salts, M.D.    Medications: I have reviewed the patient's current medications. Scheduled Meds: . clorazepate  7.5 mg Oral QHS  . cyanocobalamin  1,000 mcg Intramuscular Daily  . diltiazem  120 mg Oral Daily  . famotidine  40 mg Oral Daily  . feeding supplement  237 mL Oral Q24H  . simvastatin  10 mg Oral q1800  . sodium chloride  3 mL Intravenous Q12H  . sodium chloride  3 mL Intravenous Q12H  . Warfarin - Pharmacist Dosing Inpatient   Does not apply q1800   Continuous Infusions:  PRN Meds:.sodium chloride, albuterol, HYDROcodone-acetaminophen, ondansetron (ZOFRAN) IV, ondansetron, sodium chloride  Assessment/Plan: Active Problems:   HYPERLIPIDEMIA   CAD,  LAD stent '96, 11/05 (DES).  OM PCTA 8/04. Last cath 1/07 OK   PE and DVT in 2007   SUPRAVENTRICULAR TACHYCARDIA, PAROXYSMAL, HX OF   Long term (current) use of anticoagulants   Fibula fracture on admission 03/03/13   Near syncope   Multiple falls  PLAN:  79 y.o. female with a past medical history significant for CAD, PE, DVT, and MAT. She was previously followed by Dr Clarene Duke and is now followed by Dr Royann Shivers. She has a history of CAD nad had an LAD stent in '96. In 8/04 she had a PTCA to her OM. In Nov 2005 she had a DES placed to her LAD. She was last studied in Jan 2007 and these sites  were patent. In @007  she had multiple medical issues and hospitalizations. In Jan of that year she had chest pain and work up revealed DVT and PE. She was put on Coumadin. In March she had cholecystitis and pancreatitis treated with a percutaneous drain and ultimatkly laparoscopic cholecystectomy in July 2007. She had L1 and L4 kyphoplasty in Oct 2007. She has had an EP consult remotley for MAT and at one point was on Amiodarone but her PFTs demonstrated diffusing capacity mismatch and Amiodarone was discontinued. She has been fairly stable from a cardiac standpoint. Her LOV was 12/13. She has remained on Coumadin. Over the past few months her husband notes increasing weakness and multiple falls. She was admitted 03/03/13 when it was noted she couldn't put any weight on her Rt leg. XR revealed fractured Rt fibula. The plan is for medical Rx. The family wanted our input regarding discontinuation of Coumadin Rx.  Dr. Royann Shivers will be by to discuss with the family.    LOS: 2 days   Time spent with pt. And chart : 15 minutes. [facility] R  Nurse Practitioner Certified Pager [phone] 03/05/2013, 11:43 AM   I have seen and examined the patient along with [facility] R  Nurse Practitioner Certified.  I have reviewed the chart, notes and new data.  I agree with PAP's note.  I fully agree that long term warfarin therapy would likely be too risky for this fall-prone elderly lady. On the other hand, in the short term she requires DVT prophylaxis and will be going to a supervised setting. I would suggest continuing warfarin until she is mobile and approaching time to return home. I would be happy to see her in the office as soon as she is discharged from the SNF to manage that transition.  Thurmon Fair, MD, [facility] [phone] 03/05/2013, 2:18 PM

## 2013-03-05 NOTE — Discharge Summary (Addendum)
Physician Discharge Summary  [name] [name] WUJ:[id number] DOB: [birth year] DOA: 03/03/2013  PCP: Nadean Corwin, MD  Admit date: 03/03/2013 Discharge date: 03/06/2013  Time spent: >30  minutes  Recommendations for Outpatient Follow-up:  Reassess B12 level in 3 months and decide on route for maintenance supplementation. BMET to follow electrolytes and renal function  Discharge Diagnoses:  Active Problems:   HYPERLIPIDEMIA   CAD, LAD stent '96, 11/05 (DES).  OM PCTA 8/04. Last cath 1/07 OK   PE and DVT in 2007   SUPRAVENTRICULAR TACHYCARDIA, PAROXYSMAL, HX OF   Long term (current) use of anticoagulants   Fibula fracture on admission 03/03/13   Near syncope   Multiple falls   Discharge Condition: stable and improved. Will discharge to SNF for rehabilitation.  Diet recommendation: heart healthy diet  Filed Weights   03/05/13 1300  Weight: 87.1 kg (192 lb 0.3 oz)    History of present illness:  79 y.o. female With history of PE in 2007 reportedly on coumadin, h/o supraventricular tachycardia, CAD, B 12 deficit. Presented to the hospital after fall with subsequent right leg pain. Husband reports that wife usually ambulates with the assistance of a walker but recently within the last month she has fallen around 5 times. Nothing makes the problem better or worse and the problem has been persistent. Patient in the ED was found to have mild displaced fracture of the priximal right fibula. We were subsequently called for evaluation and recommendations due to weakness.  Hospital Course:  1. Fibula fracture  -per ortho recommendations no surgical interventions, splint or cast are required.  -weight bearing as tolerated  -PT/OT rehab at SNF. -outpatient follow up with orthopedic service  2. Paroxysmal SVT /atrial fibrillation  - continue diltiazem and coumadin for now - stable currently -see below for future coumadin plans.   3. Remote H/o PE and DVT  -will continue coumadin while in  supervised environment (hospital and SNF); plan is to discontinue given falls and high risk of life threating bleeding. Cardiology will follow patient after discharge from SNF to make that transition. -discussed with patient and patient's husband regarding risk with ongoing coumadin treatment.  -will help to prevent DVT and PE while mobility is impaired with acute fracture.   4. CAD  -continue statin and heart healthy diet.  -no CP or SOB.   5. Multiple falls  - complicating decision to continue anticoagulation  -PT recommending SNF  -TSH WNL  -vitamin b12 was only 143; can definitely contribute to loss of balance and weakness will replete as mentioned below. -physical rehab at SNF.  6. HLD: will continue statins.   7.B12 deficiency anemia: will start IM injection daily for 1 week, weekly for 1 month and monthly after that for repletion.  *Rest of medical problems remains stable and the plan is to continue current medication regimen.   Procedures: See below for x-ray reports  Consultations:  Cardiology  Orthopedic service  Discharge Exam: Filed Vitals:   03/04/13 0500 03/04/13 1400 03/04/13 2100 03/05/13 1300  BP: 128/59 105/59 115/62   Pulse: 108 74 68   Temp: 98.5 F (36.9 C) 97.6 F (36.4 C) 97.8 F (36.6 C)   TempSrc: Oral  Oral   Resp: 19 20 19    Height:    5' 1.02" (1.55 m)  Weight:    87.1 kg (192 lb 0.3 oz)  SpO2: 95% 94% 95%    General: Afebrile; no acute complaints; AAOX2, complaining of generalized weakness  Cardiovascular: rate controlled, some  PVC's on exam; no rubs or gallops.  Respiratory: no wheezing, no crackles  Abdomen: soft, NT, ND, positive BS  Musculoskeletal: trace edema bilaterally   Discharge Instructions  Discharge Orders   Future Orders Complete By Expires     Diet - low sodium heart healthy  As directed     Discharge instructions  As directed     Comments:      Maintain patient well hydrated Weight bearing as tolerated Take  medications as prescribed Follow up with PCP in 2 weeks Follow up with orthopedics service as instructed Follow up with Cardiology (Dr. Royann Shivers as soon as she is released from SNF) Coumadin to be continued while at SNF (goal is 2-3 INR; please adjust as needed)    Face-to-face encounter (required for Medicare/Medicaid patients)  As directed     Comments:      I ALLEN,ANTHONY T certify that this patient is under my care and that I, or a nurse practitioner or physician's assistant working with me, had a face-to-face encounter that meets the physician face-to-face encounter requirements with this patient on 03/03/2013. The encounter with the patient was in whole, or in part for the following medical condition(s) which is the primary reason for home health care (List medical condition):    Questions:      The encounter with the patient was in whole, or in part, for the following medical condition, which is the primary reason for home health care:  fractured fibula    I certify that, based on my findings, the following services are medically necessary home health services:  Nursing    Physical therapy    My clinical findings support the need for the above services:  Bedbound    Further, I certify that my clinical findings support that this patient is homebound due to:  Unable to leave home safely without assistance    Reason for Medically Necessary Home Health Services:  Therapy- Instruction on Safe use of Assistive Devices for ADLs    Home Health  As directed     Questions:      To provide the following care/treatments:  PT    OT    RN    Home Health Aide    Social work    Increase activity slowly  As directed         Medication List    STOP taking these medications       bumetanide 1 MG tablet  Commonly known as:  BUMEX     predniSONE 20 MG tablet  Commonly known as:  DELTASONE      TAKE these medications       albuterol 108 (90 BASE) MCG/ACT inhaler  Commonly known as:  PROVENTIL  HFA;VENTOLIN HFA  Inhale 2 puffs into the lungs every 6 (six) hours as needed for wheezing.     clorazepate 7.5 MG tablet  Commonly known as:  TRANXENE  Take 1 tablet (7.5 mg total) by mouth at bedtime.     cyanocobalamin 1000 MCG/ML injection  Commonly known as:  (VITAMIN B-12)  1000 mcg IM, daily until 03/11/13; then weekly for a month and then monthly.     diltiazem 120 MG tablet  Commonly known as:  CARDIZEM  Take 120 mg by mouth daily.     famotidine 40 MG tablet  Commonly known as:  PEPCID  Take 1 tablet (40 mg total) by mouth daily.     feeding supplement Liqd  Take 237 mLs by mouth daily.  HYDROcodone-acetaminophen 5-325 MG per tablet  Commonly known as:  NORCO/VICODIN  Take 1 tablet by mouth every 8 (eight) hours as needed for pain.     MAGNESIUM PO  Take 1 tablet by mouth daily.     PRAVACHOL 40 MG tablet  Generic drug:  pravastatin  Take 40 mg by mouth daily.     VITAMIN D-3 PO  Take 2,000 mg by mouth daily.     warfarin 2.5 MG tablet  Commonly known as:  COUMADIN  Take 2.5 mg by mouth at bedtime.       No Known Allergies     Follow-up Information   Follow up with Kathryne Hitch, MD. Schedule an appointment as soon as possible for a visit in 1 week. (As needed)    Contact information:   [address] [place] [address] [phone]       Follow up with Lucky Cowboy DAVID, MD. Schedule an appointment as soon as possible for a visit in 2 weeks.   Contact information:   [address] [place] [address] [phone]        The results of significant diagnostics from this hospitalization (including imaging, microbiology, ancillary and laboratory) are listed below for reference.    Significant Diagnostic Studies: Dg Pelvis 1-2 Views  03/03/2013   *RADIOLOGY REPORT*  Clinical Data: Fall  PELVIS - 1-2 VIEW  Comparison: 02/01/2013  Findings: Single frontal view of the pelvis submitted. No acute fracture or  subluxation.  Diffuse osteopenia.  Stable old compression fracture of the L4 vertebral body.  Degenerative changes pubic symphysis.  IMPRESSION: No acute fracture or subluxation.  Diffuse osteopenia.   Original Report Authenticated By: Natasha Mead, M.D.   Dg Femur Right  03/03/2013   *RADIOLOGY REPORT*  Clinical Data: Fall  RIGHT FEMUR - 2 VIEW  Comparison: None  Findings:  Four views of the right femur submitted.  No acute fracture or subluxation.  There is diffuse osteopenia.  Degenerative changes pubic symphysis.  IMPRESSION: No acute fracture or subluxation.  Diffuse osteopenia.   Original Report Authenticated By: Natasha Mead, M.D.   Dg Tibia/fibula Right  03/03/2013   *RADIOLOGY REPORT*  Clinical Data: Fall, leg pain  RIGHT TIBIA AND FIBULA - 2 VIEW  Comparison: None.  Findings: Four views of the right tibia-fibula submitted.  There is mild displaced fracture proximal shaft of the right fibula. Diffuse osteopenia.  IMPRESSION: Mild displaced fracture of proximal right fibula.  Diffuse osteopenia.   Original Report Authenticated By: Natasha Mead, M.D.   Dg Ankle Complete Right  03/03/2013   *RADIOLOGY REPORT*  Clinical Data: Fall  RIGHT ANKLE - COMPLETE 3+ VIEW  Comparison: None.  Findings: Three views of the right ankle submitted.  There is old appearing fracture deformity in distal right fibula.  Ankle mortise is preserved.  No definite acute fracture or subluxation.  Plantar spur of the calcaneus. Diffuse osteopenia.  IMPRESSION: Old appearing fracture of distal right fibula.  Ankle mortise is preserved.  No definite acute fracture or subluxation.  Diffuse osteopenia.   Original Report Authenticated By: Natasha Mead, M.D.   Ct Head Wo Contrast  03/04/2013   *RADIOLOGY REPORT*  Clinical Data: Altered mental status.  History of falls.  On Coumadin.  CT HEAD WITHOUT CONTRAST  Technique:  Contiguous axial images were obtained from the base of the skull through the vertex without contrast.  Comparison: None.   Findings: Diffusely enlarged ventricles and subarachnoid spaces. Small amount of periventricular white matter low density  in both cerebral hemispheres.  No intracranial hemorrhage, mass lesion or CT evidence of acute infarction.  Bilateral sphenoid sinus mucosal thickening.  IMPRESSION:  1.  No acute abnormality. 2.  Moderate atrophy and minimal chronic small vessel white matter ischemic changes. 3.  Chronic bilateral sphenoid sinusitis.   Original Report Authenticated By: Beckie Salts, M.D.   Dg Knee Complete 4 Views Right  03/03/2013   *RADIOLOGY REPORT*  Clinical Data: Fall  RIGHT KNEE - COMPLETE 4+ VIEW  Comparison: None.  Findings: Four views of the right knee submitted.  No knee fracture or subluxation.  Narrowing of medial joint compartment.  Diffuse osteopenia.  Mild chondrocalcinosis.  Spurring lateral femoral condyle and lateral tibial plateau.  Mild displaced fracture proximal right fibular shaft.  Narrowing of patellofemoral joint space.  Spurring of patella.  IMPRESSION: No knee fracture or subluxation.  Narrowing of medial joint compartment.  Diffuse osteopenia.  Mild chondrocalcinosis. Spurring lateral femoral condyle and lateral tibial plateau.  Mild displaced fracture proximal right fibular shaft.   Original Report Authenticated By: Natasha Mead, M.D.   Labs: Basic Metabolic Panel:  Recent Labs Lab 03/03/13 1135 03/04/13 1010 03/05/13 0535  NA 135 136 135  K 4.2 3.9 4.1  CL 99 98 98  CO2 27 28 28   GLUCOSE 129* 116* 113*  BUN 15 15 23   CREATININE 0.73 0.66 0.81  CALCIUM 9.4 9.5 9.1   Liver Function Tests:  Recent Labs Lab 03/03/13 1135  AST 17  ALT 14  ALKPHOS 88  BILITOT 1.5*  PROT 6.3  ALBUMIN 3.4*   CBC:  Recent Labs Lab 03/03/13 1135 03/04/13 1010 03/05/13 0535  WBC 11.0* 11.8* 10.8*  NEUTROABS 7.9*  --   --   HGB 14.2 13.8 12.4  HCT 41.8 41.4 37.4  MCV 84.3 85.0 85.2  PLT 192 196 226    Signed:  Darin Redmann  [facility] 03/05/2013, 2:24  PM

## 2013-03-05 NOTE — Evaluation (Signed)
Occupational Therapy Evaluation Patient Details Name: [name] [name] MRN: [id number] DOB: 03/02/1923 Today's Date: 03/05/2013 Time: 7829-5621 OT Time Calculation (min): 22 min  OT Assessment / Plan / Recommendation Clinical Impression    Pt. was admitted with R fibular shaft fx from a mechanical fall. Pt. has reportedly had multiple falls in the last few weeks and presents with the below problem list. Her husband is elderly and has been caring for her as best he can but cannot meet her needs currently. Pt will benefit from acute OT to maximize independence. Recommend SNF prior to d/c home.      OT Assessment  Patient needs continued OT Services    Follow Up Recommendations  SNF;Supervision/Assistance - 24 hour    Barriers to Discharge      Equipment Recommendations  Other (comment) (defer to next venue)    Recommendations for Other Services    Frequency  Min 2X/week    Precautions / Restrictions Precautions Precautions: Fall Precaution Comments: pt has had multiple falls in recent weeks per pt's husband Restrictions Weight Bearing Restrictions: Yes RLE Weight Bearing: Non weight bearing   Pertinent Vitals/Pain 4/10 on faces scale in RLE. Repositioned and increased activity.      ADL  Eating/Feeding: Independent Where Assessed - Eating/Feeding: Chair Grooming: Minimal assistance Where Assessed - Grooming: Supported sitting Upper Body Bathing: Moderate assistance Where Assessed - Upper Body Bathing: Supported sitting Lower Body Bathing: Maximal assistance Where Assessed - Lower Body Bathing: Supported sit to stand Upper Body Dressing: Moderate assistance Where Assessed - Upper Body Dressing: Supported sitting Lower Body Dressing: +2 Total assistance Lower Body Dressing: Patient Percentage: 0% Where Assessed - Lower Body Dressing: Supported sit to stand Toilet Transfer: +2 Total assistance;Performed Toilet Transfer: Patient Percentage: 40% Toilet Transfer Method: Stand  pivot;Sit to stand Toilet Transfer Equipment: Raised toilet seat with arms (or 3-in-1 over toilet) Toileting - Clothing Manipulation and Hygiene: +2 Total assistance Toileting - Clothing Manipulation and Hygiene: Patient Percentage: 0% Where Assessed - Toileting Clothing Manipulation and Hygiene: Sit to stand from 3-in-1 or toilet Tub/Shower Transfer Method: Not assessed Equipment Used: Gait belt;Rolling walker Transfers/Ambulation Related to ADLs: +2 total A ADL Comments: Pt performed toilet transfer requiring +2 Total A (40%). Pt requiring +2 total A for hygiene.  Pt with significant weakness in UE's.    OT Diagnosis: Acute pain;Generalized weakness  OT Problem List: Decreased strength;Decreased activity tolerance;Impaired balance (sitting and/or standing);Decreased safety awareness;Decreased knowledge of use of DME or AE;Decreased knowledge of precautions;Pain OT Treatment Interventions: Self-care/ADL training;Therapeutic exercise;DME and/or AE instruction;Therapeutic activities;Patient/family education;Balance training   OT Goals Acute Rehab OT Goals OT Goal Formulation: With patient Time For Goal Achievement: 03/12/13 Potential to Achieve Goals: Fair ADL Goals Pt Will Perform Grooming: with supervision;Sitting, chair ADL Goal: Grooming - Progress: Goal set today Pt Will Perform Upper Body Dressing: with min assist;Sitting, chair ADL Goal: Upper Body Dressing - Progress: Goal set today Pt Will Perform Lower Body Dressing: with min assist;Sit to stand from bed;Sit to stand from chair ADL Goal: Lower Body Dressing - Progress: Goal set today Pt Will Transfer to Toilet: with min assist;Ambulation;with DME ADL Goal: Toilet Transfer - Progress: Goal set today Pt Will Perform Toileting - Clothing Manipulation: with min assist;Standing ADL Goal: Toileting - Clothing Manipulation - Progress: Goal set today Pt Will Perform Toileting - Hygiene: with min assist;Sit to stand from  3-in-1/toilet;Sitting on 3-in-1 or toilet ADL Goal: Toileting - Hygiene - Progress: Goal set today Arm Goals Pt Will Complete  Theraband Exer: Independently;to maintain strength;10 reps;1 set;Level 2 Theraband Arm Goal: Theraband Exercises - Progress: Goal set today  Visit Information  Last OT Received On: 03/05/13 Assistance Needed: +2 PT/OT Co-Evaluation/Treatment: Yes    Subjective Data      Prior Functioning     Home Living Lives With: Spouse Available Help at Discharge: Family;Available 24 hours/day Type of Home: House Home Access: Stairs to enter [employer] of Steps: 3 Entrance Stairs-Rails: Left Home Layout: One level Bathroom Shower/Tub: Tub/shower unit;Door [employer] Toilet: Standard Home Adaptive Equipment: Programmer, systems - four wheeled;Bedside commode/3-in-1;Shower chair with back Prior Function Level of Independence: Needs assistance Needs Assistance: Gait;Transfers;Bathing;Dressing;Toileting Bath: Minimal Dressing: Minimal Toileting: Minimal Gait Assistance: min Transfer Assistance: min Able to Take Stairs?: No Driving: No Vocation: Retired Musician: No difficulties         Vision/Perception     Copywriter, advertising Arousal/Alertness: Awake/alert Behavior During Therapy: WFL for tasks assessed/performed Overall Cognitive Status: Within Functional Limits for tasks assessed    Extremity/Trunk Assessment Right Upper Extremity Assessment RUE ROM/Strength/Tone: Deficits RUE ROM/Strength/Tone Deficits: Pt able to perform AROM shoulder flexion to 90 degrees, but passively has full range. Very weak UE's Left Upper Extremity Assessment LUE ROM/Strength/Tone: Deficits LUE ROM/Strength/Tone Deficits: Pt able to perform AROM shoulder flexion to 90 degrees, but passively has full range. Very weak UE's     Mobility Bed Mobility Bed Mobility: Not assessed (pt in recliner chair when OT arrived) Transfers Transfers:  Sit to Stand;Stand to Sit Sit to Stand: 1: +2 Total assist;From chair/3-in-1 Sit to Stand: Patient Percentage: 40% Stand to Sit: 1: +2 Total assist;To chair/3-in-1 Stand to Sit: Patient Percentage: 40% Details for Transfer Assistance: Pt. with difficulty in transferring weight to either side to make steps.  Needed 2  total assist to rise to stand and control descent as welll as to shift hips for pivot to 3n1 then back to recliner chair.  Pt. able to assist minimally.  Pt. +2 for urination and BM     Exercise     Balance     End of Session OT - End of Session Equipment Utilized During Treatment: Gait belt Activity Tolerance: Patient limited by fatigue Patient left: in chair;with call bell/phone within reach  GO     Earlie Raveling OTR/L [phone] 03/05/2013, 3:15 PM

## 2013-03-05 NOTE — Clinical Social Work Psychosocial (Signed)
Clinical Social Work Department  BRIEF PSYCHOSOCIAL ASSESSMENT  Patient: [name] [name] Account Number: [id number]  Admit date: 03/03/2013 Clinical Social Worker Sabino Niemann, MSW Date/Time: 03/05/2013 11:00 AM Referred by: Physician Date Referred: 03/05/2013 Referred for   SNF Placement   Other Referral:  Interview type: Patient and patient's husband Other interview type: PSYCHOSOCIAL DATA  Living Status:Husband Admitted from facility:  Level of care:  Primary support name: Laverle Hobby Primary support relationship to patient: Husband Degree of support available:  Strong and vested  CURRENT CONCERNS  Current Concerns   Post-Acute Placement   Other Concerns:  SOCIAL WORK ASSESSMENT / PLAN  CSW met with pt re: PT recommendation for SNF.   Pt lives with her husband  CSW explained placement process and answered questions.   Pt's husband reports [facility] as his preferencee    CSW completed FL2 and initiated SNF search.   Patient can d/c on 03/06/13  Assessment/plan status: Information/Referral to [facility]  Other assessment/ plan:  Information/referral to community resources:  SNF   PTAR  PATIENT'S/FAMILY'S RESPONSE TO PLAN OF CARE:  Pt  reports she is agreeable to ST SNF in order to increase strength and independence with mobility prior to returning home  Pt verbalized understanding of placement process and appreciation for CSW assist.   Sabino Niemann, MSW [phone]

## 2013-03-06 LAB — PROTIME-INR: Prothrombin Time: 16 seconds — ABNORMAL HIGH (ref 11.6–15.2)

## 2013-03-06 NOTE — Progress Notes (Signed)
Clinical Social Work  CSW faxed DC summary to [facility] who is agreeable to admission. CSW prepared DC packet with FL2 and hard scripts included. CSW informed patient, husband and RN who were all agreeable to plans. CSW coordinated transportation via [place]. CSW is signing off.  Unk Lightning, LCSW  (Weekend Coverage)

## 2013-03-06 NOTE — Progress Notes (Signed)
Patient seen and examined. No acute complaints or concerns overnight that will avoid SNF transferring process. VS are stable and patient discharge summary, medications and plan remains appropriate.  [name] [name] [phone]

## 2013-04-09 ENCOUNTER — Telehealth: Payer: Self-pay | Admitting: Pharmacist Clinician (PhC)/ Clinical Pharmacy Specialist

## 2013-04-09 DIAGNOSIS — Z7901 Long term (current) use of anticoagulants: Secondary | ICD-10-CM

## 2013-04-09 DIAGNOSIS — I4891 Unspecified atrial fibrillation: Secondary | ICD-10-CM

## 2013-04-09 NOTE — Telephone Encounter (Signed)
Pt discharged Tuesday from [facility] in [place] after fracturing fibula on June 4.  Husband needed to know what last INR was and what to do about warfarin.  Per facility last INR on Tuesday was 1.3, pt was discharged on 2.5mg  daily.    Asked husband to increase dose to 2.5mg  qd x 3.75mg  MWF and will have [facility] repeat INR Tuesday.  Will contact [facility] for lab draw then.  (RN is Insurance risk surveyor)

## 2013-04-13 ENCOUNTER — Telehealth: Payer: Self-pay | Admitting: Cardiovascular Disease

## 2013-04-13 ENCOUNTER — Ambulatory Visit (INDEPENDENT_AMBULATORY_CARE_PROVIDER_SITE_OTHER): Payer: Self-pay | Admitting: Pharmacist Clinician (PhC)/ Clinical Pharmacy Specialist

## 2013-04-13 DIAGNOSIS — Z86718 Personal history of other venous thrombosis and embolism: Secondary | ICD-10-CM

## 2013-04-13 DIAGNOSIS — Z7901 Long term (current) use of anticoagulants: Secondary | ICD-10-CM

## 2013-04-13 NOTE — Telephone Encounter (Signed)
Spoke with Olegario Messier, will repeat INR next Tuesday 7/22

## 2013-04-13 NOTE — Telephone Encounter (Signed)
[name] [name] is calling in the results of PT/INR... INR--1.5 and PT-18.0..Please call to let her know when you want to change it again and the dosage .Marland Kitchen  Thanks

## 2013-04-20 ENCOUNTER — Telehealth: Payer: Self-pay | Admitting: *Deleted

## 2013-04-20 LAB — POCT INR: INR: 1.9

## 2013-04-20 NOTE — Telephone Encounter (Signed)
INR result 1.9.  Informed Belenda Cruise, PharmD will be notified to contact pt with instructions for coumadin.

## 2013-04-21 ENCOUNTER — Ambulatory Visit (INDEPENDENT_AMBULATORY_CARE_PROVIDER_SITE_OTHER): Payer: Self-pay | Admitting: Pharmacist Clinician (PhC)/ Clinical Pharmacy Specialist

## 2013-04-21 DIAGNOSIS — Z7901 Long term (current) use of anticoagulants: Secondary | ICD-10-CM

## 2013-04-21 DIAGNOSIS — Z86718 Personal history of other venous thrombosis and embolism: Secondary | ICD-10-CM

## 2013-04-27 ENCOUNTER — Telehealth: Payer: Self-pay | Admitting: Pharmacist Clinician (PhC)/ Clinical Pharmacy Specialist

## 2013-04-27 ENCOUNTER — Ambulatory Visit (INDEPENDENT_AMBULATORY_CARE_PROVIDER_SITE_OTHER): Payer: Self-pay | Admitting: Pharmacist

## 2013-04-27 DIAGNOSIS — Z7901 Long term (current) use of anticoagulants: Secondary | ICD-10-CM

## 2013-04-27 DIAGNOSIS — Z86718 Personal history of other venous thrombosis and embolism: Secondary | ICD-10-CM

## 2013-04-27 LAB — POCT INR: INR: 2.2

## 2013-04-27 NOTE — Telephone Encounter (Signed)
INR is 2.2 and PT is 26.7-Please call pt and advise how to take her Coumadin and also call call Brayton Caves at [facility].

## 2013-04-27 NOTE — Telephone Encounter (Signed)
Spoke with [place] and gave orders.  See anti-coag note for details.

## 2013-04-29 ENCOUNTER — Encounter: Payer: Self-pay | Admitting: Cardiovascular Disease

## 2013-05-04 ENCOUNTER — Other Ambulatory Visit: Payer: Self-pay | Admitting: Physician Assistant

## 2013-05-04 ENCOUNTER — Ambulatory Visit
Admission: RE | Admit: 2013-05-04 | Discharge: 2013-05-04 | Disposition: A | Discharge status: Home / Self Care | Payer: Medicare Other | Source: Ambulatory Visit | Attending: Physician Assistant | Admitting: Physician Assistant

## 2013-05-04 DIAGNOSIS — R319 Hematuria, unspecified: Secondary | ICD-10-CM

## 2013-05-04 DIAGNOSIS — R109 Unspecified abdominal pain: Secondary | ICD-10-CM

## 2013-05-06 ENCOUNTER — Telehealth: Payer: Self-pay | Admitting: Pharmacist

## 2013-05-06 ENCOUNTER — Ambulatory Visit (INDEPENDENT_AMBULATORY_CARE_PROVIDER_SITE_OTHER): Payer: Self-pay | Admitting: Pharmacist

## 2013-05-06 DIAGNOSIS — Z86718 Personal history of other venous thrombosis and embolism: Secondary | ICD-10-CM

## 2013-05-06 DIAGNOSIS — Z7901 Long term (current) use of anticoagulants: Secondary | ICD-10-CM

## 2013-05-06 NOTE — Telephone Encounter (Signed)
INR dosed.  See anti-coag note for details.

## 2013-05-06 NOTE — Telephone Encounter (Signed)
PT was 40.0 INR was 3.3-Pt have not taken her Coumadin for the last two days-She had a kidney stone and her primary doctor told her to not to take  Her Coumadin Tues or Wed,

## 2013-05-12 ENCOUNTER — Other Ambulatory Visit: Payer: Self-pay | Admitting: Internal Medicine

## 2013-05-13 ENCOUNTER — Telehealth: Payer: Self-pay | Admitting: Pharmacist Clinician (PhC)/ Clinical Pharmacy Specialist

## 2013-05-13 ENCOUNTER — Ambulatory Visit (INDEPENDENT_AMBULATORY_CARE_PROVIDER_SITE_OTHER): Payer: Self-pay | Admitting: Pharmacist Clinician (PhC)/ Clinical Pharmacy Specialist

## 2013-05-13 DIAGNOSIS — Z7901 Long term (current) use of anticoagulants: Secondary | ICD-10-CM

## 2013-05-13 DIAGNOSIS — Z86718 Personal history of other venous thrombosis and embolism: Secondary | ICD-10-CM

## 2013-05-13 LAB — POCT INR: INR: 3.4

## 2013-05-13 NOTE — Telephone Encounter (Signed)
INR is 3.4 Pt is 40.7-Please call to advise!

## 2013-05-19 ENCOUNTER — Encounter: Payer: Self-pay | Admitting: Cardiology

## 2013-05-19 DIAGNOSIS — I2782 Chronic pulmonary embolism: Secondary | ICD-10-CM

## 2013-05-20 ENCOUNTER — Telehealth: Payer: Self-pay | Admitting: Pharmacist Clinician (PhC)/ Clinical Pharmacy Specialist

## 2013-05-20 LAB — POCT INR: INR: 1.6

## 2013-05-20 NOTE — Telephone Encounter (Signed)
[name] [name] w/ [facility] - Pt's inr is 1/6 and PT is 19.0.

## 2013-05-20 NOTE — Telephone Encounter (Signed)
Message forwarded to K. Alvstad, PharmD.  

## 2013-05-21 ENCOUNTER — Ambulatory Visit (INDEPENDENT_AMBULATORY_CARE_PROVIDER_SITE_OTHER): Payer: Medicare Other | Admitting: Cardiovascular Disease

## 2013-05-21 ENCOUNTER — Encounter: Payer: Self-pay | Admitting: Cardiovascular Disease

## 2013-05-21 ENCOUNTER — Ambulatory Visit (INDEPENDENT_AMBULATORY_CARE_PROVIDER_SITE_OTHER): Payer: Self-pay | Admitting: Pharmacist Clinician (PhC)/ Clinical Pharmacy Specialist

## 2013-05-21 VITALS — BP 148/80 | HR 67 | Resp 16 | Ht 65.0 in | Wt 192.6 lb

## 2013-05-21 DIAGNOSIS — Z7901 Long term (current) use of anticoagulants: Secondary | ICD-10-CM

## 2013-05-21 DIAGNOSIS — Z9181 History of falling: Secondary | ICD-10-CM

## 2013-05-21 DIAGNOSIS — I2510 Atherosclerotic heart disease of native coronary artery without angina pectoris: Secondary | ICD-10-CM

## 2013-05-21 DIAGNOSIS — E785 Hyperlipidemia, unspecified: Secondary | ICD-10-CM

## 2013-05-21 DIAGNOSIS — Z86718 Personal history of other venous thrombosis and embolism: Secondary | ICD-10-CM

## 2013-05-21 DIAGNOSIS — Z8679 Personal history of other diseases of the circulatory system: Secondary | ICD-10-CM

## 2013-05-21 DIAGNOSIS — R296 Repeated falls: Secondary | ICD-10-CM

## 2013-05-21 NOTE — Telephone Encounter (Signed)
Warfarin d/c today due to fall risk, will start ASA 162mg  qd, pt informed during visit with Dr. Royann Shivers

## 2013-05-21 NOTE — Patient Instructions (Addendum)
STOP COUMADIN (WARFARIN).  Your physician recommends that you schedule a follow-up appointment in: 6 MONTHS.

## 2013-05-31 NOTE — Assessment & Plan Note (Signed)
Currently asymptomatic. Most recent evaluation was the cardiac catheterization 2007

## 2013-05-31 NOTE — Assessment & Plan Note (Signed)
Not recently a problem this was very prominent at the time of her angioplasty procedures

## 2013-05-31 NOTE — Assessment & Plan Note (Signed)
I do not believe it is safe for [name] [name] to continue taking warfarin. She has had many falls and serious injuries, luckily not cranial injuries. Her DVT appears to have occurred with a clear precipitant cause and was more than 7 years ago. At this point I think the risk of anticoagulation exceeds the benefits. Wonders whether the reported B12 deficiency could explain her poor gait and instability. The B12 level should be repeated. If she has pernicious anemia, the most common reason for B12 deficiency, this is a lifelong disorder and requires lifelong B12 supplementation.

## 2013-05-31 NOTE — Progress Notes (Signed)
Patient ID: [name] [name], female   DOB: 01-16-1923, 79 y.o.   MRN: [id number]     Reason for office visit CAD, history of venous thromboembolic disease  [name] [name] is elderly and is becoming ever more unsteady and frail. She has had 3 or 4 falls just in the month of June. She had a fibular fracture because of one of these falls. Luckily these have not been associated with serious head injury. Her husband is afraid of leaving the home and has become somewhat reclusive. They argued repeatedly about this in the office today. She states that she will be "just fine" if he leaves the house to perform other activities. He is terrified to leave her side for even one second. He did not have any close family, friends or neighbors.  She is on chronic anticoagulation with warfarin secondary to history of deep venous thrombosis and pulmonary embolism that occurred in 2007. It sounds like that the DVT was associated with elective cardiac catheterization. She has not had a new venous thrombotic or embolic event since that time.   Her falls seem to be loss of balance. One occasion she was reaching to grab hold of a towel right and fell forward. She does not have dizziness or loss of consciousness before these falls. She spends most of the day sitting in a chair because of her unsteadiness. She reports feeling short of breath with minimal activity and has bilateral ankle edema.  Mr.  Klas told me today that [name]. [name] was diagnosed with vitamin B12 deficiency at one point. She received B12 injections for a while but these have subsequently been stopped.  She had an LAD artery bare-metal stent placed in 1996 and then a drug-eluting stent in the mid LAD in 2005, and cutting balloon angioplasty of the oblique marginal artery. Both the stents and all the other coronary segments were widely patent by cardiac catheterization in 2007. There is normal left ventricular function by echocardiography    No Known  Allergies  Current Outpatient Prescriptions  Medication Sig Dispense Refill  . albuterol (PROVENTIL HFA;VENTOLIN HFA) 108 (90 BASE) MCG/ACT inhaler Inhale 2 puffs into the lungs every 6 (six) hours as needed for wheezing.      . Cholecalciferol (VITAMIN D-3 PO) Take 2,000 mg by mouth daily.       . clorazepate (TRANXENE) 7.5 MG tablet Take 1 tablet (7.5 mg total) by mouth at bedtime.  30 tablet  0  . cyanocobalamin (,VITAMIN B-12,) 1000 MCG/ML injection 1000 mcg IM, daily until 03/11/13; then weekly for a month and then monthly.  1 mL  0  . diltiazem (CARDIZEM) 120 MG tablet Take 120 mg by mouth daily.      . famotidine (PEPCID) 40 MG tablet TAKE ONE TABLET BY MOUTH EVERY DAY  90 tablet  1  . HYDROcodone-acetaminophen (NORCO/VICODIN) 5-325 MG per tablet Take 1 tablet by mouth every 8 (eight) hours as needed for pain.  15 tablet  0  . MAGNESIUM PO Take 1 tablet by mouth daily.      . pravastatin (PRAVACHOL) 40 MG tablet Take 40 mg by mouth daily.      . bumetanide (BUMEX) 1 MG tablet Take 1 mg by mouth as needed.       No current facility-administered medications for this visit.    Past Medical History  Diagnosis Date  . B12 deficiency   . HTN (hypertension)   . DDD (degenerative disc disease)   . Supraventricular tachycardia   .  Iron deficiency anemia   . HLD (hyperlipidemia)   . Osteoporosis   . Chronic anticoagulation   . CAD (coronary artery disease)     CFX PTCA '96,LAD stent in '05, cath '07  . Choledocholithiasis   . History of pancreatitis   . Diverticulosis   . Hiatal hernia   . Hx of adenomatous colonic polyps   . Pulmonary emboli 2007    Past Surgical History  Procedure Laterality Date  . Kyphosis surgery    . Vertebroplasty  2007  . Laminectomy    . Cholecystectomy  2007  . Cardiac catheterization  '96, 8/04,11/05, 1/07  . Ercp with sphincterotomy  2007    Family History  Problem Relation Age of Onset  . Stroke Father   . Stroke Brother   . Diabetes Son    . Colon cancer Neg Hx     History   Social History  . Marital Status: Married    Spouse Name: Chase Picket    Number of Children: 2  . Years of Education: N/A   Occupational History  . retired    Social History Main Topics  . Smoking status: Never Smoker   . Smokeless tobacco: Never Used  . Alcohol Use: No  . Drug Use: No  . Sexual Activity: Not on file   Other Topics Concern  . Not on file   Social History Narrative  . No narrative on file    Review of systems: The patient specifically denies any chest pain at rest or with exertion,  orthopnea, paroxysmal nocturnal dyspnea, syncope, palpitations, focal neurological deficits, intermittent claudication, lower extremity edema, unexplained weight gain, cough, hemoptysis or wheezing.   PHYSICAL EXAM BP 148/80  Pulse 67  Resp 16  Ht 5\' 5"  (1.651 m)  Wt 192 lb 9.6 oz (87.363 kg)  BMI 32.05 kg/m2  General: Alert, oriented x3, no distress Head: no evidence of trauma, PERRL, EOMI, no exophtalmos or lid lag, no myxedema, no xanthelasma; normal ears, nose and oropharynx Neck: normal jugular venous pulsations and no hepatojugular reflux; brisk carotid pulses without delay and no carotid bruits Chest: clear to auscultation, no signs of consolidation by percussion or palpation, normal fremitus, symmetrical and full respiratory excursions Cardiovascular: normal position and quality of the apical impulse, regular rhythm, normal first and second heart sounds, no murmurs, rubs or gallops Abdomen: no tenderness or distention, no masses by palpation, no abnormal pulsatility or arterial bruits, normal bowel sounds, no hepatosplenomegaly Extremities: wearing calf orthopedic device ; no clubbing, cyanosis or edema; 2+ radial, ulnar and brachial pulses bilaterally; 2+ right femoral, posterior tibial and dorsalis pedis pulses; 2+ left femoral, posterior tibial and dorsalis pedis pulses; no subclavian or femoral bruits Neurological: grossly  nonfocal    EKG: Sinus rhythm, delayed anterior R wave progression, cannot exclude septal infarction  Lipid Panel  No results found for this basename: chol, trig, hdl, cholhdl, vldl, ldlcalc    BMET    Component Value Date/Time   NA 135 03/05/2013 0535   K 4.1 03/05/2013 0535   CL 98 03/05/2013 0535   CO2 28 03/05/2013 0535   GLUCOSE 113* 03/05/2013 0535   BUN 23 03/05/2013 0535   CREATININE 0.81 03/05/2013 0535   CALCIUM 9.1 03/05/2013 0535   GFRNONAA 63* 03/05/2013 0535   GFRAA 74* 03/05/2013 0535     ASSESSMENT AND PLAN Multiple falls I do not believe it is safe for [name]. [name] to continue taking warfarin. She has had many falls and serious injuries, luckily  not cranial injuries. Her DVT appears to have occurred with a clear precipitant cause and was more than 7 years ago. At this point I think the risk of anticoagulation exceeds the benefits. Wonders whether the reported B12 deficiency could explain her poor gait and instability. The B12 level should be repeated. If she has pernicious anemia, the most common reason for B12 deficiency, this is a lifelong disorder and requires lifelong B12 supplementation.  PE and DVT in 2007    CAD, LAD stent '96, 11/05 ([facility]).  OM PCTA 8/04. Last cath 1/07 OK Currently asymptomatic. Most recent evaluation was the cardiac catheterization 2007  SUPRAVENTRICULAR TACHYCARDIA, PAROXYSMAL, HX OF Not recently a problem this was very prominent at the time of her angioplasty procedures   Orders Placed This Encounter  Procedures  . Electrocardiogram report   Meds ordered this encounter  Medications  . bumetanide (BUMEX) 1 MG tablet    Sig: Take 1 mg by mouth as needed.    Junious Silk, MD, [facility] [phone] office [phone] pager

## 2013-05-31 NOTE — Assessment & Plan Note (Signed)
Satisfactory levels at her most recent lipid profile about 6 months ago

## 2013-07-02 ENCOUNTER — Emergency Department (HOSPITAL_COMMUNITY)
Admission: EM | Admit: 2013-07-02 | Discharge: 2013-07-02 | Disposition: A | Discharge status: Home / Self Care | Payer: Medicare Other | Attending: Emergency Medicine | Admitting: Emergency Medicine

## 2013-07-02 ENCOUNTER — Encounter (HOSPITAL_COMMUNITY): Payer: Self-pay | Admitting: Emergency Medicine

## 2013-07-02 ENCOUNTER — Emergency Department (HOSPITAL_COMMUNITY): Payer: Medicare Other

## 2013-07-02 DIAGNOSIS — Z9889 Other specified postprocedural states: Secondary | ICD-10-CM | POA: Insufficient documentation

## 2013-07-02 DIAGNOSIS — Z8719 Personal history of other diseases of the digestive system: Secondary | ICD-10-CM | POA: Insufficient documentation

## 2013-07-02 DIAGNOSIS — M719 Bursopathy, unspecified: Secondary | ICD-10-CM | POA: Insufficient documentation

## 2013-07-02 DIAGNOSIS — Z79899 Other long term (current) drug therapy: Secondary | ICD-10-CM | POA: Insufficient documentation

## 2013-07-02 DIAGNOSIS — Z8639 Personal history of other endocrine, nutritional and metabolic disease: Secondary | ICD-10-CM | POA: Insufficient documentation

## 2013-07-02 DIAGNOSIS — M779 Enthesopathy, unspecified: Secondary | ICD-10-CM

## 2013-07-02 DIAGNOSIS — Z860100 Personal history of colon polyps, unspecified: Secondary | ICD-10-CM | POA: Insufficient documentation

## 2013-07-02 DIAGNOSIS — M67919 Unspecified disorder of synovium and tendon, unspecified shoulder: Secondary | ICD-10-CM | POA: Insufficient documentation

## 2013-07-02 DIAGNOSIS — Z86711 Personal history of pulmonary embolism: Secondary | ICD-10-CM | POA: Insufficient documentation

## 2013-07-02 DIAGNOSIS — I2510 Atherosclerotic heart disease of native coronary artery without angina pectoris: Secondary | ICD-10-CM | POA: Insufficient documentation

## 2013-07-02 DIAGNOSIS — Z862 Personal history of diseases of the blood and blood-forming organs and certain disorders involving the immune mechanism: Secondary | ICD-10-CM | POA: Insufficient documentation

## 2013-07-02 DIAGNOSIS — I10 Essential (primary) hypertension: Secondary | ICD-10-CM | POA: Insufficient documentation

## 2013-07-02 DIAGNOSIS — M810 Age-related osteoporosis without current pathological fracture: Secondary | ICD-10-CM | POA: Insufficient documentation

## 2013-07-02 LAB — TROPONIN I: Troponin I: <0.3 ng/mL (ref <0.30)

## 2013-07-02 NOTE — ED Provider Notes (Signed)
CSN: [id number]     Arrival date & time 07/02/13  1526 History   First MD Initiated Contact with Patient 07/02/13 1550     Chief Complaint  Patient presents with  . Shoulder Pain   (Consider location/radiation/quality/duration/timing/severity/associated sxs/prior Treatment) Patient is a 79 y.o. female presenting with shoulder pain. The history is provided by the patient. No language interpreter was used.  Shoulder Pain This is a new problem. The current episode started today. The problem occurs intermittently. The problem has been resolved. Pertinent negatives include no chest pain, congestion, coughing, nausea, vomiting or weakness. She has tried nothing for the symptoms.   Pt is a 79 year old female who presents with a complaint of left shoulder pain. She reports that she was at home earlier this evening and had an episode of left shoulder pain for about 30 sec to 1 minute. She denies associated chest pain, shortness of breath, nausea, vomiting, fever, wheezing or recent illness. No known sick exposures and no recent travel. She reports that she has been using her Tomeca Helm at home and has been putting a lot of pressure on her upper extremities when she walks due to the resolving pain that she has been having in her right knee.    Past Medical History  Diagnosis Date  . B12 deficiency   . HTN (hypertension)   . DDD (degenerative disc disease)   . Supraventricular tachycardia   . Iron deficiency anemia   . HLD (hyperlipidemia)   . Osteoporosis   . Chronic anticoagulation   . CAD (coronary artery disease)     CFX PTCA '96,LAD stent in '05, cath '07  . Choledocholithiasis   . History of pancreatitis   . Diverticulosis   . Hiatal hernia   . Hx of adenomatous colonic polyps   . Pulmonary emboli 2007   Past Surgical History  Procedure Laterality Date  . Kyphosis surgery    . Vertebroplasty  2007  . Laminectomy    . Cholecystectomy  2007  . Cardiac catheterization  '96, 8/04,11/05,  1/07  . Ercp with sphincterotomy  2007   Family History  Problem Relation Age of Onset  . Stroke Father   . Stroke Brother   . Diabetes Son   . Colon cancer Neg Hx    History  Substance Use Topics  . Smoking status: Never Smoker   . Smokeless tobacco: Never Used  . Alcohol Use: No   OB History   Grav Para Term Preterm Abortions TAB SAB Ect Mult Living                 Review of Systems  HENT: Negative for congestion.   Respiratory: Negative for cough.   Cardiovascular: Negative for chest pain.  Gastrointestinal: Negative for nausea and vomiting.  Neurological: Negative for weakness.  All other systems reviewed and are negative.    Allergies  Review of patient's allergies indicates no known allergies.  Home Medications   Current Outpatient Rx  Name  Route  Sig  Dispense  Refill  . albuterol (PROVENTIL HFA;VENTOLIN HFA) 108 (90 BASE) MCG/ACT inhaler   Inhalation   Inhale 2 puffs into the lungs every 6 (six) hours as needed for wheezing.         . bumetanide (BUMEX) 1 MG tablet   Oral   Take 1 mg by mouth as needed.         . Cholecalciferol (VITAMIN D-3 PO)   Oral   Take 2,000 mg by mouth  daily.          . clorazepate (TRANXENE) 7.5 MG tablet   Oral   Take 1 tablet (7.5 mg total) by mouth at bedtime.   30 tablet   0   . cyanocobalamin (,VITAMIN B-12,) 1000 MCG/ML injection      1000 mcg IM, daily until 03/11/13; then weekly for a month and then monthly.   1 mL   0   . diltiazem (CARDIZEM) 120 MG tablet   Oral   Take 120 mg by mouth daily.         . famotidine (PEPCID) 40 MG tablet      TAKE ONE TABLET BY MOUTH EVERY DAY   90 tablet   1   . HYDROcodone-acetaminophen (NORCO/VICODIN) 5-325 MG per tablet   Oral   Take 1 tablet by mouth every 8 (eight) hours as needed for pain.   15 tablet   0   . MAGNESIUM PO   Oral   Take 1 tablet by mouth daily.         . pravastatin (PRAVACHOL) 40 MG tablet   Oral   Take 40 mg by mouth daily.           BP 158/69  Pulse 67  Temp(Src) 98 F (36.7 C) (Oral)  Resp 16  SpO2 95% Physical Exam  Vitals reviewed. Constitutional: She is oriented to person, place, and time. She appears well-developed and well-nourished. No distress.  HENT:  Head: Normocephalic and atraumatic.  Right Ear: External ear normal.  Left Ear: External ear normal.  Mouth/Throat: Oropharynx is clear and moist.  Eyes: Pupils are equal, round, and reactive to light.  Neck: Normal range of motion. Neck supple. No JVD present.  Cardiovascular: Normal rate, regular rhythm, normal heart sounds and intact distal pulses.   Pulmonary/Chest: Effort normal and breath sounds normal.  Abdominal: Soft. Bowel sounds are normal.  Musculoskeletal:       Left shoulder: She exhibits decreased range of motion, tenderness and bony tenderness. She exhibits no deformity.  Left shoulder, ROM: Can extend her arm upward but has difficulty moving her arm backwards. Point tenderness anterior left shoulder. No numbness, tingling, good sensation. 2+ distal pulses.  Lymphadenopathy:    She has no cervical adenopathy.  Neurological: She is alert and oriented to person, place, and time.  Skin: Skin is warm and dry.  Psychiatric: She has a normal mood and affect. Her behavior is normal. Judgment and thought content normal.    ED Course  Procedures (including critical care time) Labs Review Labs Reviewed - No data to display Imaging Review No results found.  Date: 07/03/2013  Rate: 64   Rhythm: normal sinus rhythm  QRS Axis: normal  Intervals: normal  ST/T Wave abnormalities: normal  Conduction Disutrbances:none  Narrative Interpretation: sinus rhythm  Old EKG Reviewed: none available Reviewed by Dr. Redgie Grayer.  MDM   1. Tendinitis     Not suspicious for cardiac origin of pain. No associated chest pain, shortness of breath, N/V, no recent fever, cough or illness. COPD well controlled.  Troponin negative. Isolated left  shoulder pain, with point tenderness anteriorly. X-ray shows high riding humeral head which may indicate a rotator cuff tendinopathy.      Irish Elders, NP 07/02/13 1839  Irish Elders, NP 07/03/13 [phone]

## 2013-07-02 NOTE — ED Notes (Signed)
Patient was having a snack with her husband and started having pain in her left shoulder, no pain in chest, just in shoulder.  The Pt is not in pain now

## 2013-07-02 NOTE — ED Notes (Signed)
Patient transported to X-ray 

## 2013-07-02 NOTE — ED Notes (Addendum)
Pt from home, c/o left shoulder pain. Pt states sudden onset of sharp pain lasting for 30 sec. No other complaints. No pain at this time.    Addendum. Per husband, states pain in shoulder off and on for about an hour prior to calling EMS.

## 2013-07-03 NOTE — ED Provider Notes (Signed)
Medical screening examination/treatment/procedure(s) were performed by non-physician practitioner and as supervising physician I was immediately available for consultation/collaboration.  Milissa Fesperman, MD 07/03/13 0216 

## 2013-07-19 ENCOUNTER — Other Ambulatory Visit: Payer: Self-pay | Admitting: Orthopaedic Surgery

## 2013-07-19 DIAGNOSIS — M79604 Pain in right leg: Secondary | ICD-10-CM

## 2013-07-19 DIAGNOSIS — Z86718 Personal history of other venous thrombosis and embolism: Secondary | ICD-10-CM

## 2013-07-20 ENCOUNTER — Ambulatory Visit
Admission: RE | Admit: 2013-07-20 | Discharge: 2013-07-20 | Disposition: A | Discharge status: Home / Self Care | Payer: Medicare Other | Source: Ambulatory Visit | Attending: Orthopaedic Surgery | Admitting: Orthopaedic Surgery

## 2013-07-20 ENCOUNTER — Other Ambulatory Visit: Payer: Medicare Other

## 2013-07-20 DIAGNOSIS — M79604 Pain in right leg: Secondary | ICD-10-CM

## 2013-07-20 DIAGNOSIS — Z86718 Personal history of other venous thrombosis and embolism: Secondary | ICD-10-CM

## 2013-08-11 ENCOUNTER — Other Ambulatory Visit: Payer: Self-pay | Admitting: *Deleted

## 2013-08-11 MED ORDER — DILTIAZEM HCL ER COATED BEADS 120 MG PO CP24: 120.0000 mg | ORAL_CAPSULE | Freq: Every day | ORAL | Status: DC

## 2013-08-11 MED ORDER — DILTIAZEM HCL 120 MG PO TABS: 120.0000 mg | ORAL_TABLET | Freq: Every day | ORAL | Status: DC

## 2013-08-23 ENCOUNTER — Telehealth: Payer: Self-pay

## 2013-08-23 NOTE — Telephone Encounter (Signed)
Pt called and said Dr. Magnus Ivan and [facility] is requesting Rx clearance for Meloxicam and Tramadol. Per Mck, it is recommended that pt gets cardiac clearance from her cardiologist, Dr.Croitoru, before starting Meloxicam and Tramadol. Pt is also recommended to call back with any further questions. Pt's husband Economist) is aware and will call back after calling cardiologist.

## 2013-08-24 ENCOUNTER — Telehealth: Payer: Self-pay | Admitting: Cardiovascular Disease

## 2013-08-24 NOTE — Telephone Encounter (Signed)
Please call question about medicine. Her other doctor wants to find out if she can take this medicine along with the other medicine she is taking.

## 2013-08-24 NOTE — Telephone Encounter (Signed)
Returned call and informed husband per instructions by MD.  Verbalized understanding and agreed w/ plan.  Stated he doesn't know if they will get the tramadol b/c it gives her a hangover feeling.  RN informed that is what Dr. Royann Shivers was referring to by it being slightly sedating and advised pt is careful taking this med.  Husband verbalized understanding.

## 2013-08-24 NOTE — Telephone Encounter (Signed)
Yes, OK too take these medications. Caution with tramadol which can be slightly sedating. Be careful to avoid falls.

## 2013-08-24 NOTE — Telephone Encounter (Signed)
Returned call and pt verified x 2 w/ pt's husband, Chase Picket.  Stated pt having pain w/ walking and the doctor recommended she take meloxicam and tramadol.  Wants to know if it's okay for pt take these medications w/ her heart condition.  Informed Dr. Royann Shivers will be notified for further instructions and he will be notified when response given.  Verbalized understanding and asked that he be called on his cell at [phone].

## 2013-09-13 ENCOUNTER — Telehealth: Payer: Self-pay | Admitting: Cardiovascular Disease

## 2013-09-13 ENCOUNTER — Telehealth: Payer: Self-pay | Admitting: *Deleted

## 2013-09-13 NOTE — Telephone Encounter (Signed)
Please call-concerning her Diliiazem.

## 2013-09-13 NOTE — Telephone Encounter (Signed)
Returned call and pt verified x 2 w/ pt's husband, Chase Picket.  Stated pt's insurance won't pay for diltiazem anymore and he doesn't know what's going on.  Informed RN will call pharmacy and call him back.  Verbalized understanding.  Call to [facility] and spoke w/ Selena Batten.  Stated when Rx sent, they received a notification that it is not covered anymore and PA needed.  RN asked if it is b/c it's generic for Cardizem CD and Selena Batten stated it just shows diltiazem isn't covered.  Number is [phone].  Stated they did fax PA form on the 13th.  Informed office was closed on Saturday, but will take care of.  Call to Mr. Goding and informed as stated above.  Informed he will be notified once response received for PA.  Verbalized understanding and asked that he be called on his cell phone (number saved).  Pt also stated pt has been out since yesterday.  Forwarded to B. Lassiter, CMA.

## 2013-09-13 NOTE — Telephone Encounter (Signed)
Insurance is requiring a PA for Diltiazem 120mg  qd.  Will review w/Dr. C to see if there is an alternative.

## 2013-09-13 NOTE — Telephone Encounter (Signed)
If there are no diltiazem sustained release products that are covered, she can take verapamil SR 120 mg daily. In fact, until we hear about PA results, please call that in for her

## 2013-09-14 NOTE — Telephone Encounter (Signed)
Per Mr. Minkler he just picked up a RX for the diltiazem CD 120mg  for $5.50.  He was not aware it needed a prior auth.  Pharmacy called to see if this prior Berkley Harvey is needed for  2015 or what.  Pharmacist doesn't know what happened by it was approved.  Not sure what will happen next month.  If prior auth is needed switch to verapamil sr 120mg  qd.

## 2013-09-14 NOTE — Telephone Encounter (Signed)
Will do a prior auth for Diltiazem CD

## 2013-10-18 ENCOUNTER — Encounter: Payer: Self-pay | Admitting: *Deleted

## 2013-10-18 IMAGING — CR DG KNEE COMPLETE 4+V*R*
4 series · 4 of 4 positions shown · non-contrast
Comparison: None.

CLINICAL DATA: Fall

RIGHT KNEE - COMPLETE 4+ VIEW

[t knee ap right]
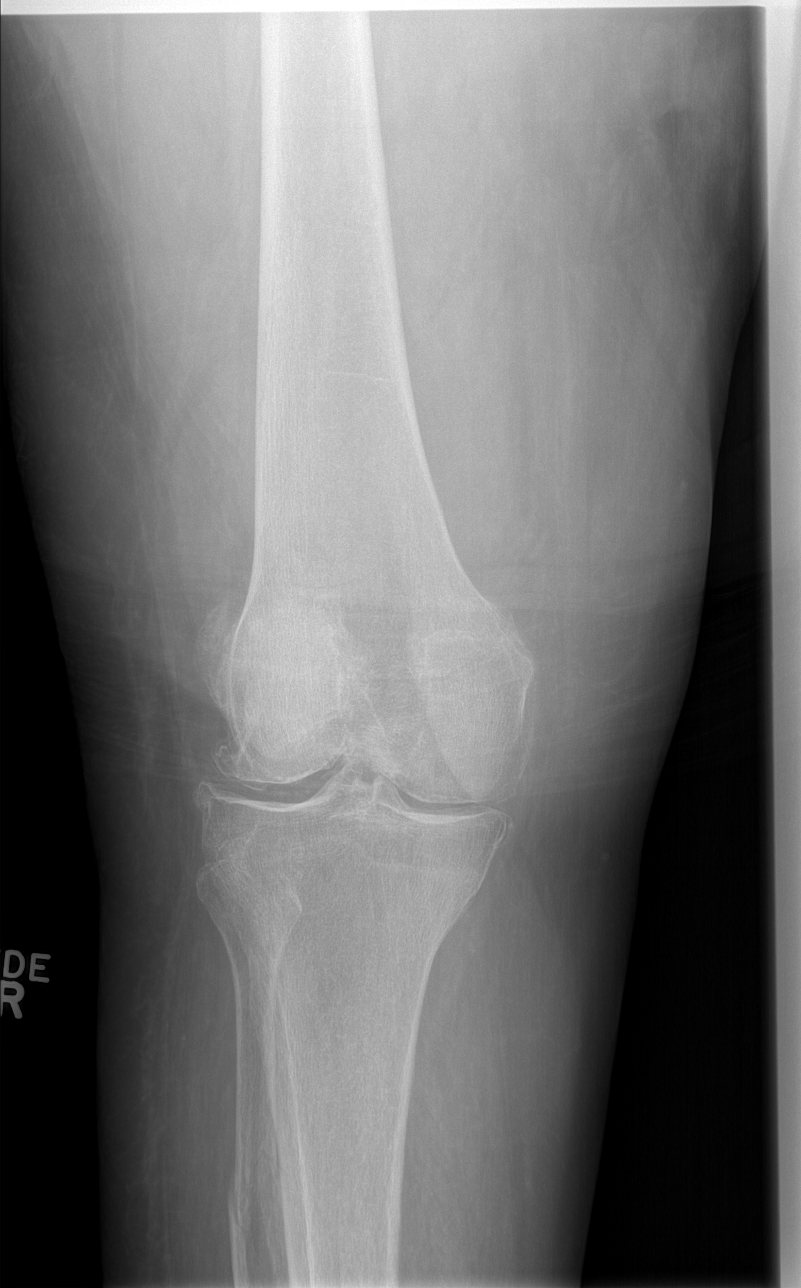

[t knee oblique right (1 of 2)]
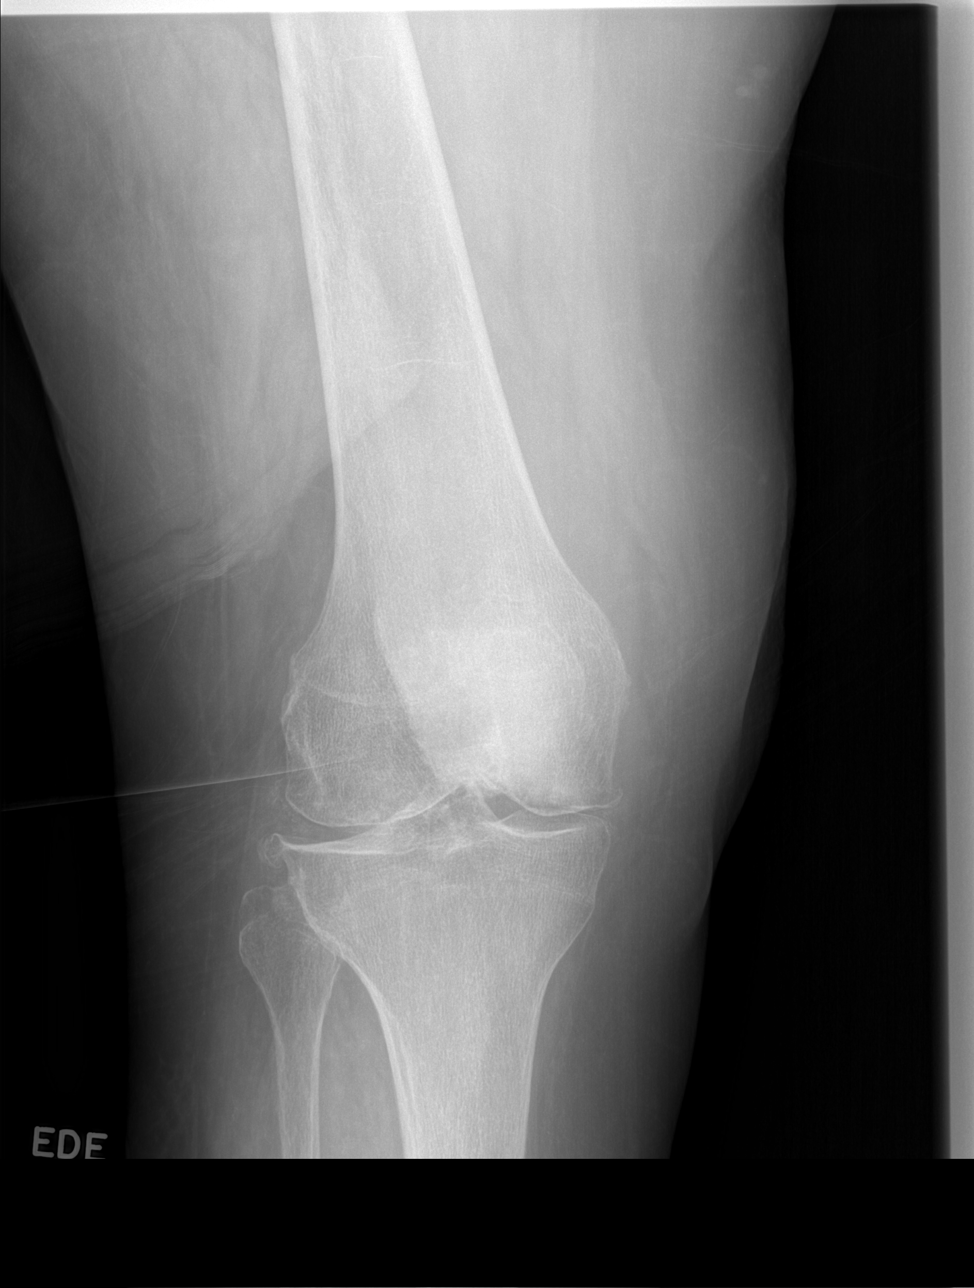

[t knee oblique right (2 of 2)]
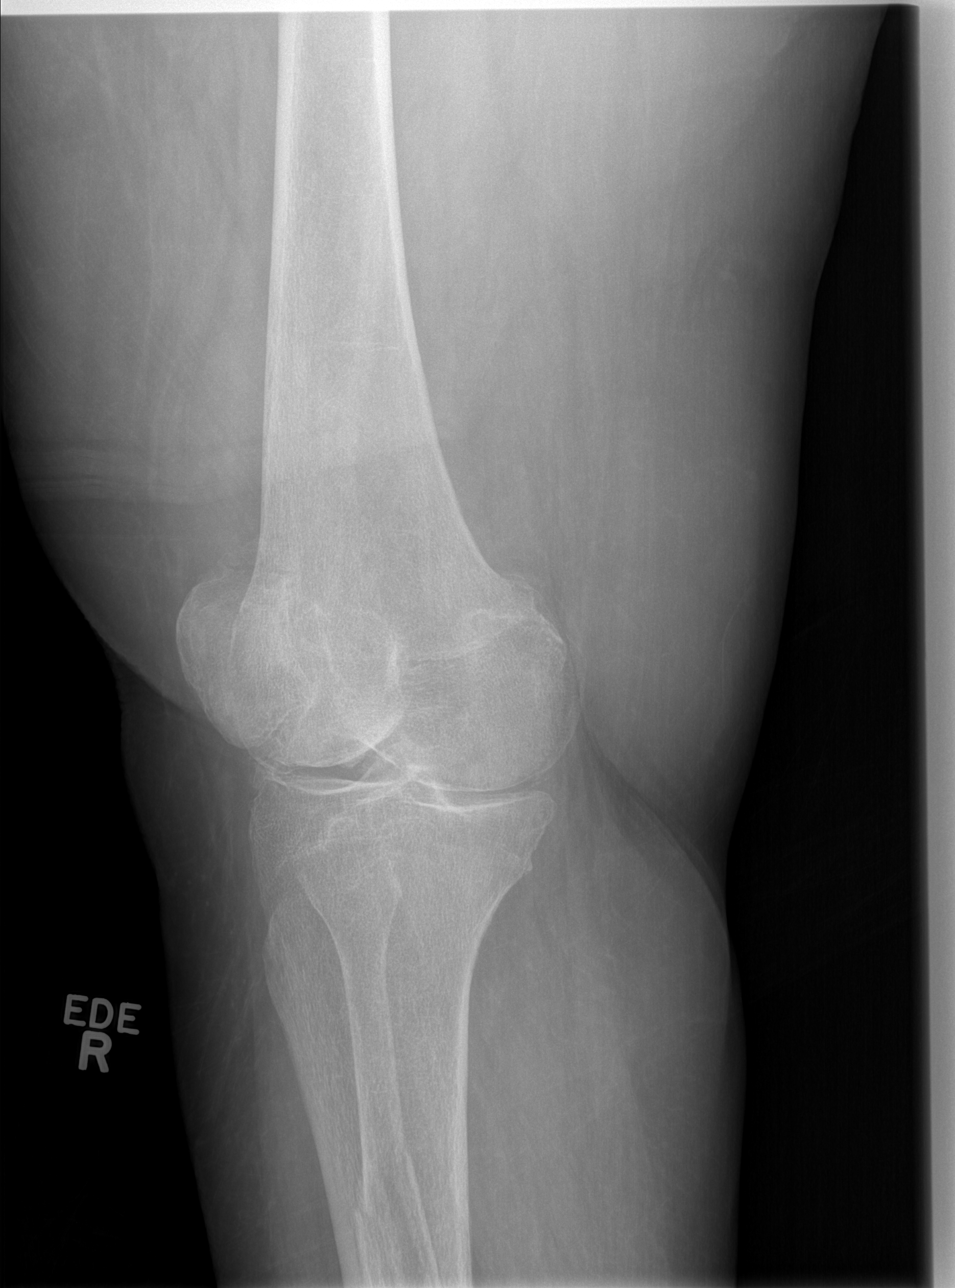

[t knee lat right]
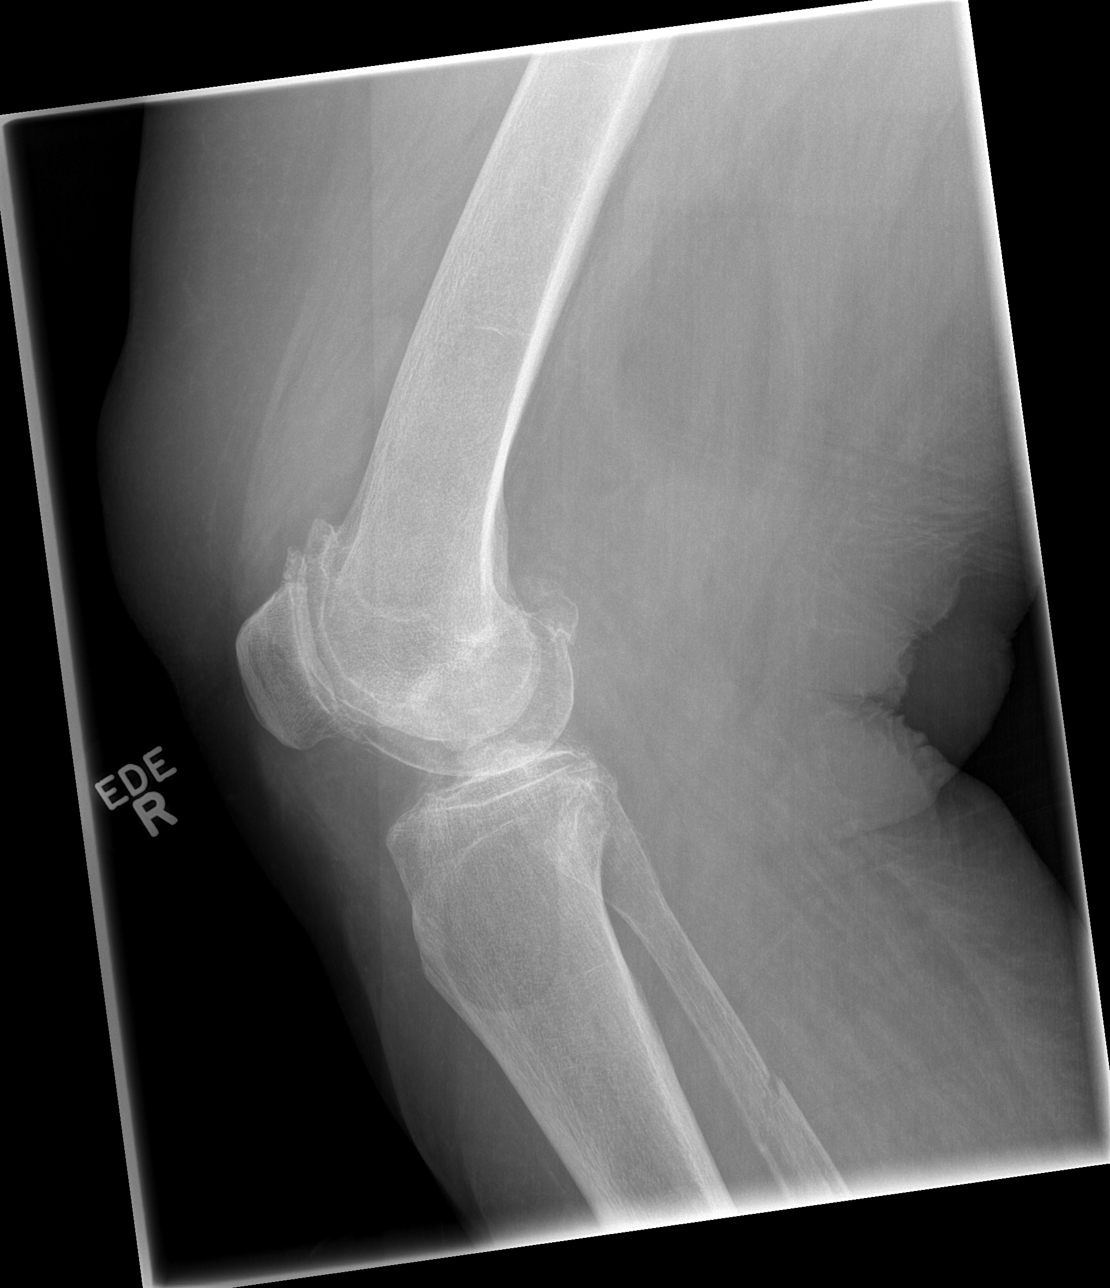

[4 of 4 positions shown; findings below may reference images not displayed]

FINDINGS: Four views of the right knee submitted.  No knee fracture
or subluxation.  Narrowing of medial joint compartment.  Diffuse
osteopenia.  Mild chondrocalcinosis.  Spurring lateral femoral
condyle and lateral tibial plateau.  Mild displaced fracture
proximal right fibular shaft.  Narrowing of patellofemoral joint
space.  Spurring of patella.
IMPRESSION: No knee fracture or subluxation.  Narrowing of medial joint
compartment.  Diffuse osteopenia.  Mild chondrocalcinosis.
Spurring lateral femoral condyle and lateral tibial plateau.  Mild
displaced fracture proximal right fibular shaft.]

## 2013-10-18 IMAGING — CR DG TIBIA/FIBULA 2V*R*
4 series · 4 of 4 positions shown · non-contrast
Comparison: None.

CLINICAL DATA: Fall, leg pain

RIGHT TIBIA AND FIBULA - 2 VIEW

[t tib/fib ap right (1 of 2)]
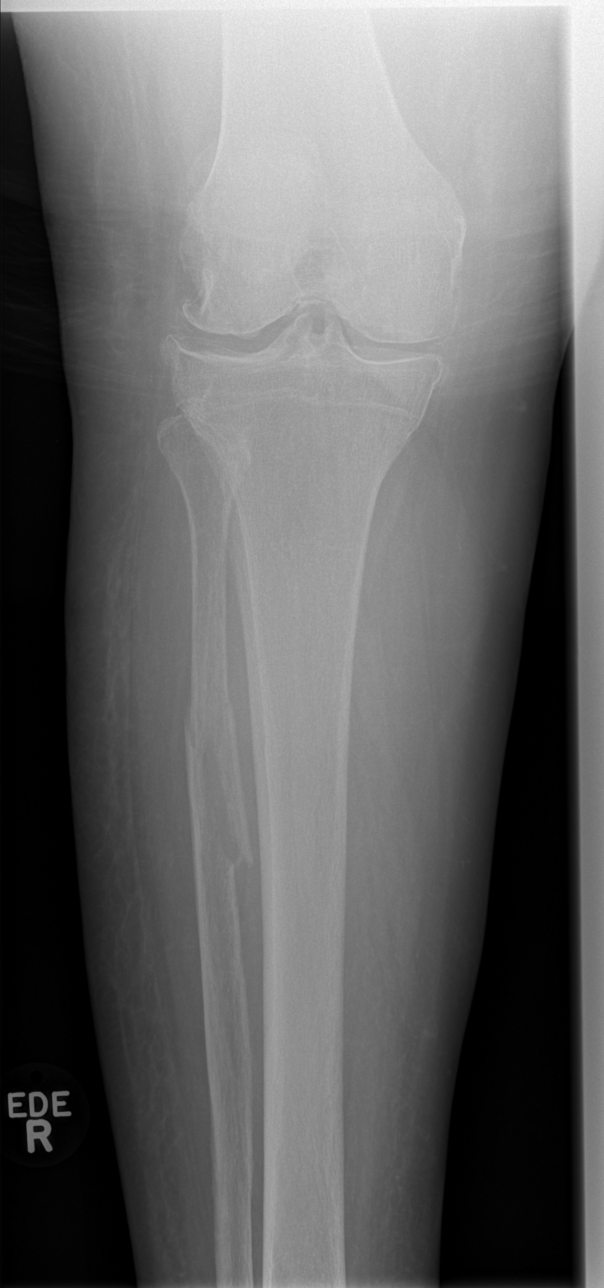

[t tib/fib ap right (2 of 2)]
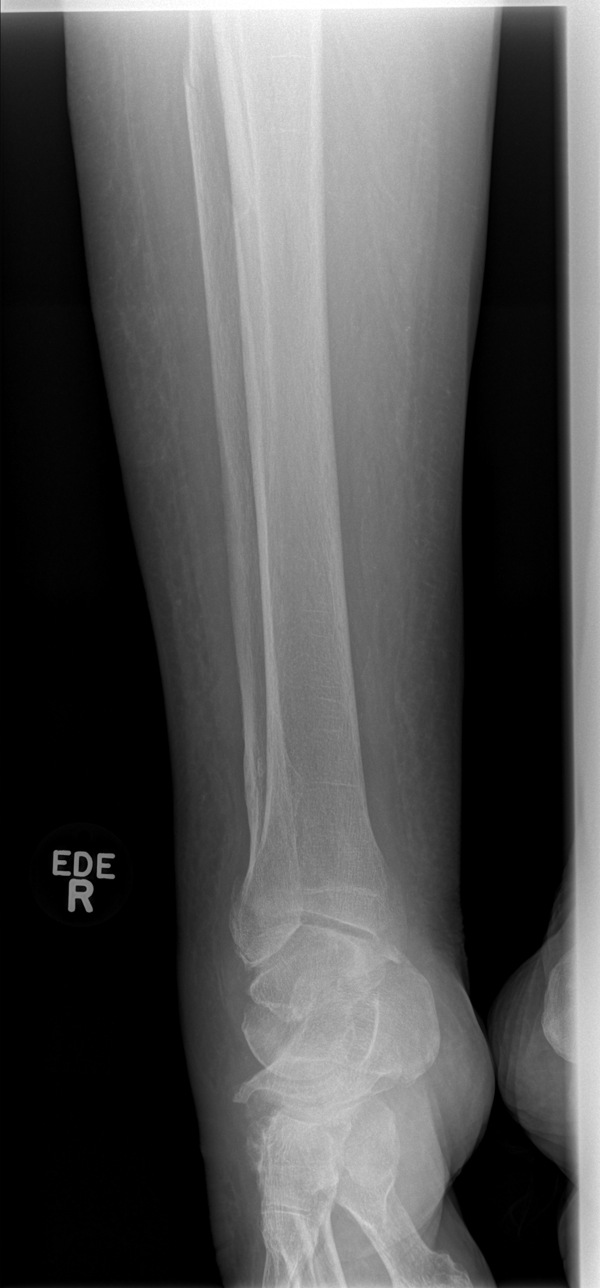

[t tib/fib lat right (1 of 2)]
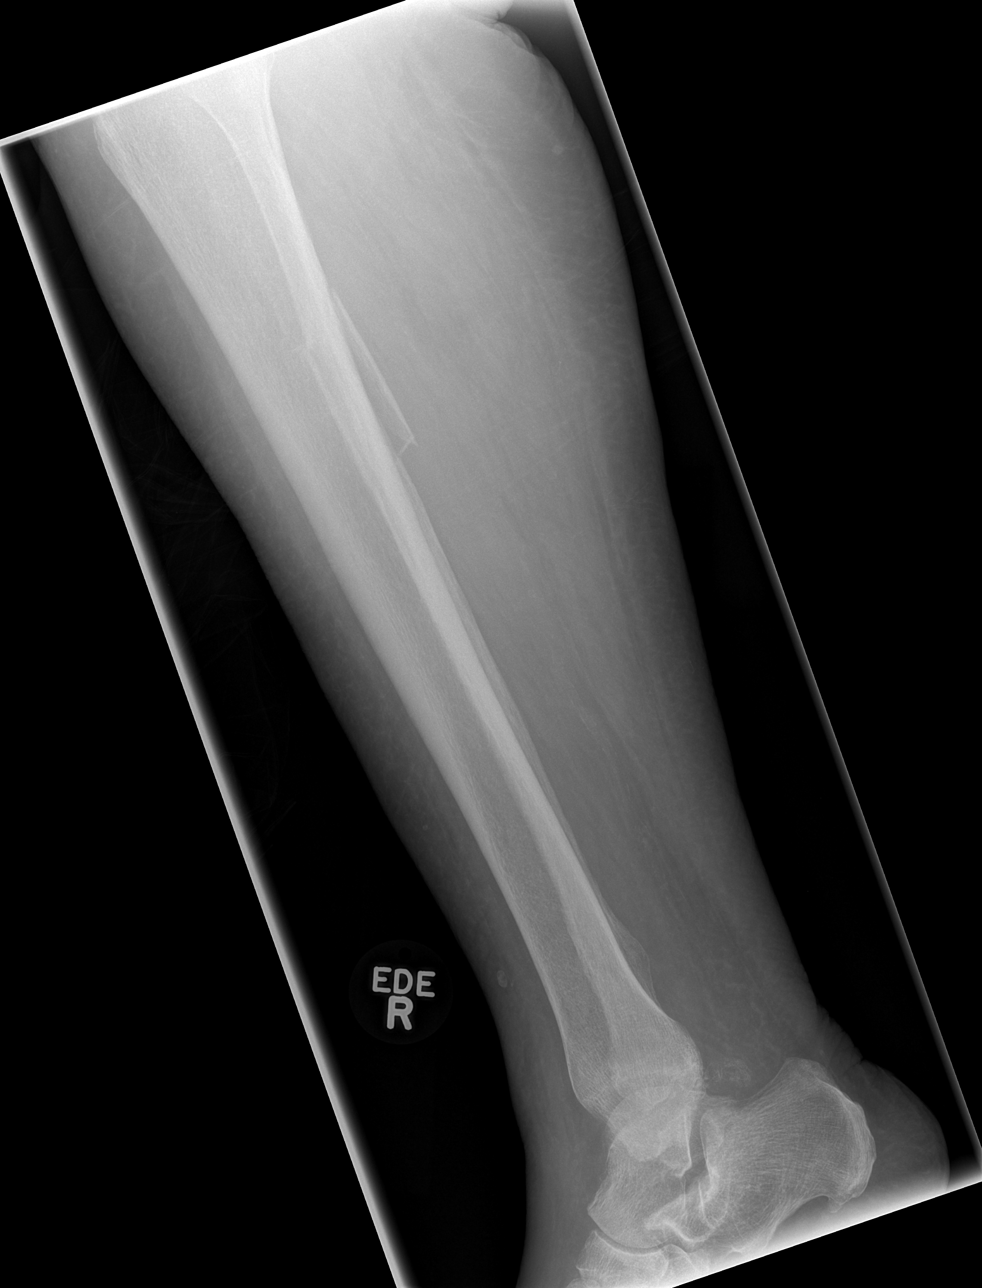

[t tib/fib lat right (2 of 2)]
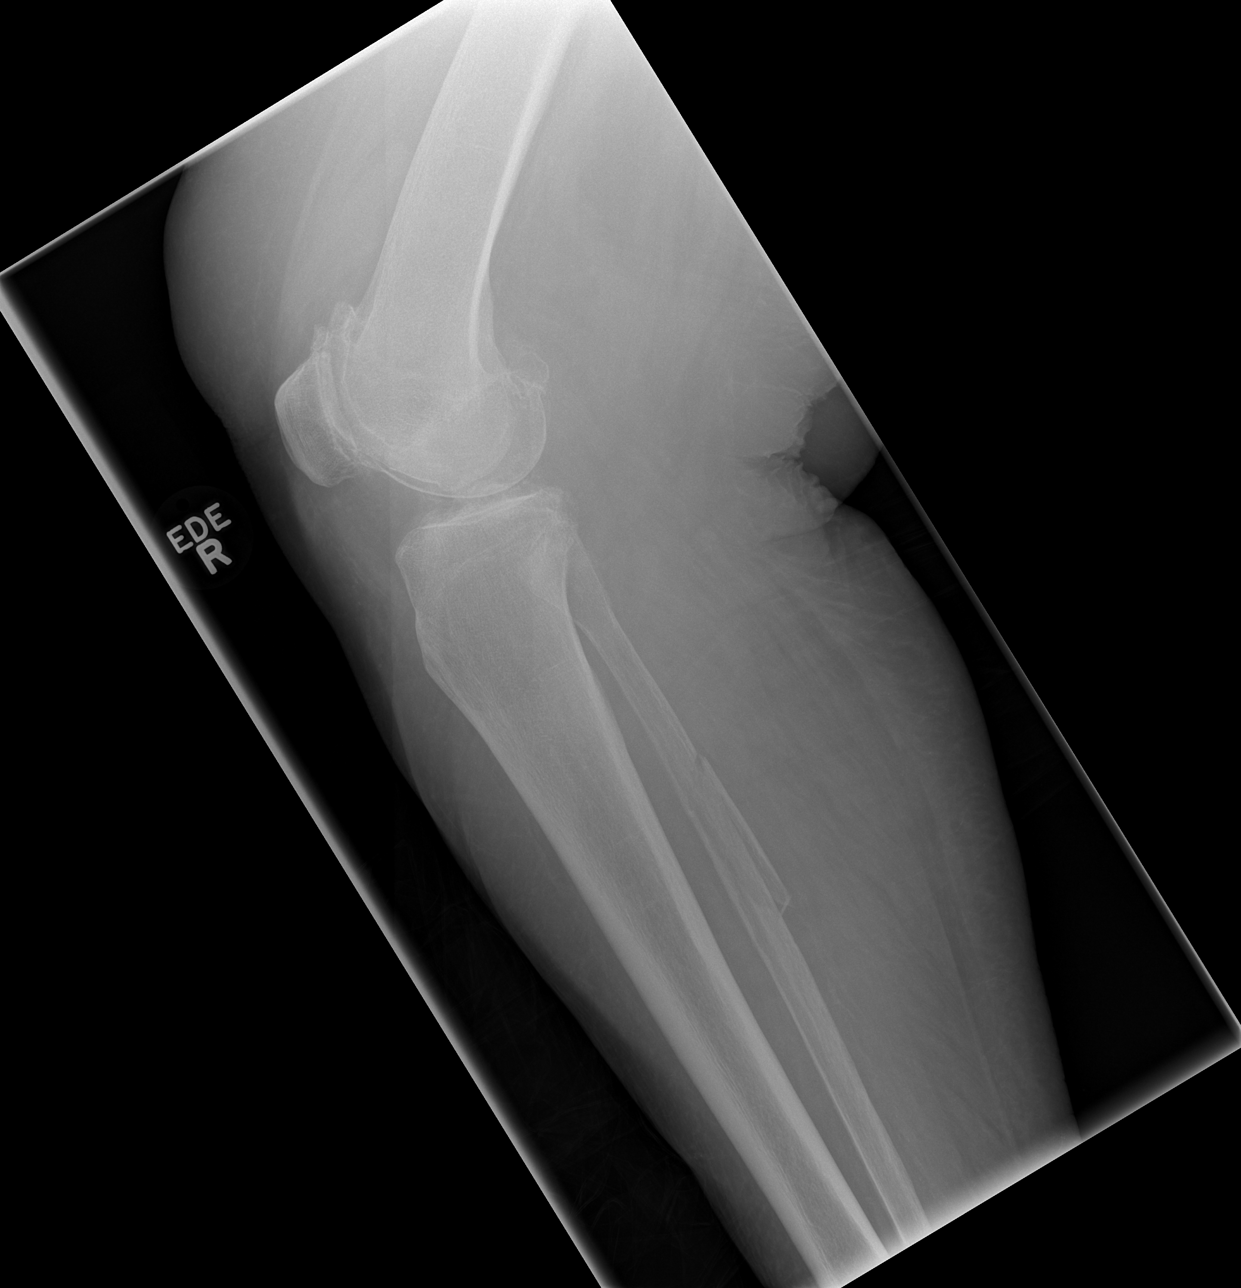

[4 of 4 positions shown; findings below may reference images not displayed]

FINDINGS: Four views of the right tibia-fibula submitted.  There is
mild displaced fracture proximal shaft of the right fibula. Diffuse
osteopenia.
IMPRESSION: Mild displaced fracture of proximal right fibula.  Diffuse
osteopenia.

## 2013-10-21 ENCOUNTER — Ambulatory Visit (INDEPENDENT_AMBULATORY_CARE_PROVIDER_SITE_OTHER): Payer: Medicare Other | Admitting: Emergency Medicine

## 2013-10-21 ENCOUNTER — Encounter: Payer: Self-pay | Admitting: Emergency Medicine

## 2013-10-21 VITALS — BP 122/70 | HR 68 | Temp 98.0°F | Resp 16 | Ht 62.0 in

## 2013-10-21 DIAGNOSIS — R7309 Other abnormal glucose: Secondary | ICD-10-CM

## 2013-10-21 DIAGNOSIS — I10 Essential (primary) hypertension: Secondary | ICD-10-CM

## 2013-10-21 DIAGNOSIS — E782 Mixed hyperlipidemia: Secondary | ICD-10-CM

## 2013-10-21 DIAGNOSIS — R351 Nocturia: Secondary | ICD-10-CM

## 2013-10-21 MED ORDER — CIPROFLOXACIN HCL 250 MG PO TABS: 250.0000 mg | ORAL_TABLET | Freq: Two times a day (BID) | ORAL | Status: AC

## 2013-10-21 NOTE — Patient Instructions (Addendum)
Dehydration, Adult Dehydration means your body does not have as much fluid as it needs. Your kidneys, brain, and heart will not work properly without the right amount of fluids and salt.  HOME CARE  Ask your doctor how to replace body fluid losses (rehydrate).  Drink enough fluids to keep your pee (urine) clear or pale yellow.  Drink small amounts of fluids often if you feel sick to your stomach (nauseous) or throw up (vomit).  Eat like you normally do.  Avoid:  Foods or drinks high in sugar.  Bubbly (carbonated) drinks.  Juice.  Very hot or cold fluids.  Drinks with caffeine.  Fatty, greasy foods.  Alcohol.  Tobacco.  Eating too much.  Gelatin desserts.  Wash your hands to avoid spreading germs (bacteria, viruses).  Only take medicine as told by your doctor.  Keep all doctor visits as told. GET HELP RIGHT AWAY IF:   You cannot drink something without throwing up.  You get worse even with treatment.  Your vomit has blood in it or looks greenish.  Your poop (stool) has blood in it or looks black and tarry.  You have not peed in 6 to 8 hours.  You pee a small amount of very dark pee.  You have a fever.  You pass out (faint).  You have belly (abdominal) pain that gets worse or stays in one spot (localizes).  You have a rash, stiff neck, or bad headache.  You get easily annoyed, sleepy, or are hard to wake up.  You feel weak, dizzy, or very thirsty. MAKE SURE YOU:   Understand these instructions.  Will watch your condition.  Will get help right away if you are not doing well or get worse. Document Released: 07/13/2009 Document Revised: 12/09/2011 Document Reviewed: 05/06/2011 [facility] Patient Information 2014 [place], [place]. [place]

## 2013-10-22 LAB — CBC WITH DIFFERENTIAL/PLATELET
Basophils Absolute: 0 10*3/uL (ref 0.0–0.1)
Basophils Relative: 0 % (ref 0–1)
EOS PCT: 1 % (ref 0–5)
Eosinophils Absolute: 0.1 10*3/uL (ref 0.0–0.7)
HEMATOCRIT: 44.5 % (ref 36.0–46.0)
Hemoglobin: 14.8 g/dL (ref 12.0–15.0)
LYMPHS PCT: 20 % (ref 12–46)
Lymphs Abs: 1.9 10*3/uL (ref 0.7–4.0)
MCH: 27.6 pg (ref 26.0–34.0)
MCHC: 33.3 g/dL (ref 30.0–36.0)
MCV: 82.9 fL (ref 78.0–100.0)
Monocytes Absolute: 0.9 10*3/uL (ref 0.1–1.0)
Monocytes Relative: 9 % (ref 3–12)
Neutro Abs: 6.9 10*3/uL (ref 1.7–7.7)
Neutrophils Relative %: 70 % (ref 43–77)
PLATELETS: 258 10*3/uL (ref 150–400)
RBC: 5.37 MIL/uL — ABNORMAL HIGH (ref 3.87–5.11)
RDW: 14.5 % (ref 11.5–15.5)
WBC: 9.9 10*3/uL (ref 4.0–10.5)

## 2013-10-22 LAB — BASIC METABOLIC PANEL WITH GFR
BUN: 18 mg/dL (ref 6–23)
CHLORIDE: 99 meq/L (ref 96–112)
CO2: 34 meq/L — AB (ref 19–32)
Calcium: 9.6 mg/dL (ref 8.4–10.5)
Creat: 0.83 mg/dL (ref 0.50–1.10)
GFR, Est African American: 73 mL/min
GFR, Est Non African American: 63 mL/min
Glucose, Bld: 100 mg/dL — ABNORMAL HIGH (ref 70–99)
Potassium: 4.9 mEq/L (ref 3.5–5.3)
SODIUM: 139 meq/L (ref 135–145)

## 2013-10-22 LAB — URINALYSIS, ROUTINE W REFLEX MICROSCOPIC
Glucose, UA: NEGATIVE mg/dL
Hgb urine dipstick: NEGATIVE
LEUKOCYTES UA: NEGATIVE
NITRITE: POSITIVE — AB
PH: 5.5 (ref 5.0–8.0)
Protein, ur: NEGATIVE mg/dL
SPECIFIC GRAVITY, URINE: 1.024 (ref 1.005–1.030)
Urobilinogen, UA: 1 mg/dL (ref 0.0–1.0)

## 2013-10-22 LAB — URINALYSIS, MICROSCOPIC ONLY: CRYSTALS: NONE SEEN

## 2013-10-22 LAB — HEPATIC FUNCTION PANEL
ALK PHOS: 59 U/L (ref 39–117)
ALT: 9 U/L (ref 0–35)
AST: 12 U/L (ref 0–37)
Albumin: 4 g/dL (ref 3.5–5.2)
BILIRUBIN INDIRECT: 0.4 mg/dL (ref 0.0–0.9)
Bilirubin, Direct: 0.1 mg/dL (ref 0.0–0.3)
TOTAL PROTEIN: 6.4 g/dL (ref 6.0–8.3)
Total Bilirubin: 0.5 mg/dL (ref 0.3–1.2)

## 2013-10-22 LAB — LIPID PANEL
Cholesterol: 149 mg/dL (ref 0–200)
HDL: 61 mg/dL (ref >39)
LDL Cholesterol: 69 mg/dL (ref 0–99)
Total CHOL/HDL Ratio: 2.4 Ratio
Triglycerides: 95 mg/dL (ref <150)
VLDL: 19 mg/dL (ref 0–40)

## 2013-10-23 LAB — URINE CULTURE

## 2013-10-25 NOTE — Progress Notes (Signed)
Subjective:    Patient ID: [name] [name], female    DOB: Jun 19, 1923, 80 y.o.   MRN: [id number]  HPI Comments: 80 yo female presents for 3 month F/U for HTN, Cholesterol, Pre-Dm, D. deficient She eats descent. She tries to keep busy and exercises a little. She notes BP is good at home.   She has ben feeling mild LLQ abdomen discomfort for several days. She also notes she has increased Nocturia without any diet/ lifestyle changes.   Hyperlipidemia  Hypertension    Current Outpatient Prescriptions on File Prior to Visit  Medication Sig Dispense Refill  . acetaminophen-codeine (TYLENOL #3) 300-30 MG per tablet Take by mouth every 4 (four) hours as needed for moderate pain.      Marland Kitchen. aspirin 81 MG tablet Take 162 mg by mouth daily.      . bumetanide (BUMEX) 1 MG tablet Take 1 mg by mouth daily.      . Cholecalciferol (VITAMIN D-3 PO) Take 4,000 Units by mouth daily.       Marland Kitchen. diltiazem (CARDIZEM CD) 120 MG 24 hr capsule Take 1 capsule (120 mg total) by mouth daily.  30 capsule  6  . famotidine (PEPCID) 40 MG tablet Take 40 mg by mouth daily.      Marland Kitchen. MAGNESIUM PO Take 1 tablet by mouth daily.      . pravastatin (PRAVACHOL) 40 MG tablet Take 40 mg by mouth daily.       No current facility-administered medications on file prior to visit.   ALLERGIES Advil  Past Medical History  Diagnosis Date  . B12 deficiency   . HTN (hypertension)   . DDD (degenerative disc disease)   . Supraventricular tachycardia   . Iron deficiency anemia   . HLD (hyperlipidemia)   . Osteoporosis   . Chronic anticoagulation   . CAD (coronary artery disease)     CFX PTCA '96,LAD stent in '05, cath '07  . Choledocholithiasis   . History of pancreatitis   . Diverticulosis   . Hiatal hernia   . Hx of adenomatous colonic polyps   . Pulmonary emboli 2007     Review of Systems  Gastrointestinal: Positive for abdominal pain.  Genitourinary: Positive for frequency.  All other systems reviewed and are  negative.   BP 122/70  Pulse 68  Temp(Src) 98 F (36.7 C) (Temporal)  Resp 16  Ht 5\' 2"  (1.575 m)     Objective:   Physical Exam  Nursing note and vitals reviewed. Constitutional: She is oriented to person, place, and time. She appears well-developed and well-nourished. No distress.  HENT:  Head: Normocephalic and atraumatic.  Right Ear: External ear normal.  Left Ear: External ear normal.  Nose: Nose normal.  Mouth/Throat: Oropharynx is clear and moist.  Eyes: Conjunctivae and EOM are normal.  Neck: Normal range of motion. Neck supple. No JVD present. No thyromegaly present.  Cardiovascular: Normal rate, regular rhythm, normal heart sounds and intact distal pulses.   Pulmonary/Chest: Effort normal and breath sounds normal.  Abdominal: Soft. Bowel sounds are normal. She exhibits no distension and no mass. There is tenderness. There is no rebound and no guarding.  Mild LLQ  Musculoskeletal: Normal range of motion. She exhibits no edema and no tenderness.  Lymphadenopathy:    She has no cervical adenopathy.  Neurological: She is alert and oriented to person, place, and time. No cranial nerve deficit.  Skin: Skin is warm and dry. No rash noted. No erythema. No pallor.  Psychiatric: She has a normal mood and affect. Her behavior is normal. Judgment and thought content normal.          Assessment & Plan:  1.  3 month F/U for HTN, Cholesterol, Pre-Dm, D. Deficient. Needs healthy diet, cardio QD and obtain healthy weight. Check Labs, Check BP if >130/80 call office 2. Nocturia/ LLQ pain- Bland diet, push fluids, Cipro 250 mg AD, check labs

## 2013-11-01 ENCOUNTER — Other Ambulatory Visit: Payer: Self-pay | Admitting: Internal Medicine

## 2013-11-11 ENCOUNTER — Other Ambulatory Visit: Payer: Self-pay | Admitting: Internal Medicine

## 2013-11-17 ENCOUNTER — Ambulatory Visit (INDEPENDENT_AMBULATORY_CARE_PROVIDER_SITE_OTHER): Payer: Medicare Other | Admitting: Internal Medicine

## 2013-11-17 ENCOUNTER — Encounter: Payer: Self-pay | Admitting: Internal Medicine

## 2013-11-17 VITALS — BP 122/74 | HR 92 | Temp 98.4°F | Resp 18

## 2013-11-17 DIAGNOSIS — J441 Chronic obstructive pulmonary disease with (acute) exacerbation: Secondary | ICD-10-CM

## 2013-11-17 MED ORDER — PREDNISONE 20 MG PO TABS: 20.0000 mg | ORAL_TABLET | ORAL | Status: DC

## 2013-11-17 MED ORDER — HYDROCODONE-ACETAMINOPHEN 5-325 MG PO TABS: ORAL_TABLET | ORAL | Status: DC

## 2013-11-17 MED ORDER — AZITHROMYCIN 250 MG PO TABS: ORAL_TABLET | ORAL | Status: DC

## 2013-11-17 NOTE — Patient Instructions (Signed)
Acute Bronchitis Bronchitis is when the airways that extend from the windpipe into the lungs get red, puffy, and painful (inflamed). Bronchitis often causes thick spit (mucus) to develop. This leads to a cough. A cough is the most common symptom of bronchitis. In acute bronchitis, the condition usually begins suddenly and goes away over time (usually in 2 weeks). Smoking, allergies, and asthma can make bronchitis worse. Repeated episodes of bronchitis may cause more lung problems. HOME CARE  Rest.  Drink enough fluids to keep your pee (urine) clear or pale yellow (unless you need to limit fluids as told by your doctor).  Only take over-the-counter or prescription medicines as told by your doctor.  Avoid smoking and secondhand smoke. These can make bronchitis worse. If you are a smoker, think about using nicotine gum or skin patches. Quitting smoking will help your lungs heal faster.  Reduce the chance of getting bronchitis again by:  Washing your hands often.  Avoiding people with cold symptoms.  Trying not to touch your hands to your mouth, nose, or eyes.  Follow up with your doctor as told. GET HELP IF: Your symptoms do not improve after 1 week of treatment. Symptoms include:  Cough.  Fever.  Coughing up thick spit.  Body aches.  Chest congestion.  Chills.  Shortness of breath.  Sore throat. GET HELP RIGHT AWAY IF:   You have an increased fever.  You have chills.  You have severe shortness of breath.  You have bloody thick spit (sputum).  You throw up (vomit) often.  You lose too much body fluid (dehydration).  You have a severe headache.  You faint. MAKE SURE YOU:   Understand these instructions.  Will watch your condition.  Will get help right away if you are not doing well or get worse. Document Released: 03/04/2008 Document Revised: 05/19/2013 Document Reviewed: 03/09/2013 [facility] Patient Information 2014 [place], [place].   Chronic  Obstructive Pulmonary Disease Chronic obstructive pulmonary disease (COPD) is a common lung condition in which airflow from the lungs is limited. COPD is a general term that can be used to describe many different lung problems that limit airflow, including both chronic bronchitis and emphysema. If you have COPD, your lung function will probably never return to normal, but there are measures you can take to improve lung function and make yourself feel better.  CAUSES   Smoking (common).   Exposure to secondhand smoke.   Genetic problems.  Chronic inflammatory lung diseases or recurrent infections. SYMPTOMS   Shortness of breath, especially with physical activity.   Deep, persistent (chronic) cough with a large amount of thick mucus.   Wheezing.   Rapid breaths (tachypnea).   Gray or bluish discoloration (cyanosis) of the skin, especially in fingers, toes, or lips.   Fatigue.   Weight loss.   Frequent infections or episodes when breathing symptoms become much worse (exacerbations).   Chest tightness. DIAGNOSIS  Your healthcare provider will take a medical history and perform a physical examination to make the initial diagnosis. Additional tests for COPD may include:   Lung (pulmonary) function tests.  Chest X-ray.  CT scan.  Blood tests. TREATMENT  Treatment available to help you feel better when you have COPD include:   Inhaler and nebulizer medicines. These help manage the symptoms of COPD and make your breathing more comfortable  Supplemental oxygen. Supplemental oxygen is only helpful if you have a low oxygen level in your blood.   Exercise and physical activity. These are beneficial for  nearly all people with COPD. Some people may also benefit from a pulmonary rehabilitation program. HOME CARE INSTRUCTIONS   Take all medicines (inhaled or pills) as directed by your health care provider.  Only take over-the-counter or prescription medicines for pain,  fever, or discomfort as directed by your health care provider.   Avoid over-the-counter medicines or cough syrups that dry up your airway (such as antihistamines) and slow down the elimination of secretions unless instructed otherwise by your healthcare provider.   If you are a smoker, the most important thing that you can do is stop smoking. Continuing to smoke will cause further lung damage and breathing trouble. Ask your health care provider for help with quitting smoking. He or she can direct you to community resources or hospitals that provide support.  Avoid exposure to irritants such as smoke, chemicals, and fumes that aggravate your breathing.  Use oxygen therapy and pulmonary rehabilitation if directed by your health care provider. If you require home oxygen therapy, ask your healthcare provider whether you should purchase a pulse oximeter to measure your oxygen level at home.   Avoid contact with individuals who have a contagious illness.  Avoid extreme temperature and humidity changes.  Eat healthy foods. Eating smaller, more frequent meals and resting before meals may help you maintain your strength.  Stay active, but balance activity with periods of rest. Exercise and physical activity will help you maintain your ability to do things you want to do.  Preventing infection and hospitalization is very important when you have COPD. Make sure to receive all the vaccines your health care provider recommends, especially the pneumococcal and influenza vaccines. Ask your healthcare provider whether you need a pneumonia vaccine.  Learn and use relaxation techniques to manage stress.  Learn and use controlled breathing techniques as directed by your health care provider. Controlled breathing techniques include:   Pursed lip breathing. Start by breathing in (inhaling) through your nose for 1 second. Then, purse your lips as if you were going to whistle and breathe out (exhale) through  the pursed lips for 2 seconds.   Diaphragmatic breathing. Start by putting one hand on your abdomen just above your waist. Inhale slowly through your nose. The hand on your abdomen should move out. Then purse your lips and exhale slowly. You should be able to feel the hand on your abdomen moving in as you exhale.   Learn and use controlled coughing to clear mucus from your lungs. Controlled coughing is a series of short, progressive coughs. The steps of controlled coughing are:  1. Lean your head slightly forward.  2. Breathe in deeply using diaphragmatic breathing.  3. Try to hold your breath for 3 seconds.  4. Keep your mouth slightly open while coughing twice.  5. Spit any mucus out into a tissue.  6. Rest and repeat the steps once or twice as needed. SEEK MEDICAL CARE IF:   You are coughing up more mucus than usual.   There is a change in the color or thickness of your mucus.   Your breathing is more labored than usual.   Your breathing is faster than usual.  SEEK IMMEDIATE MEDICAL CARE IF:   You have shortness of breath while you are resting.   You have shortness of breath that prevents you from:  Being able to talk.   Performing your usual physical activities.   You have chest pain lasting longer than 5 minutes.   Your skin color is more cyanotic than  usual.  You measure low oxygen saturations for longer than 5 minutes with a pulse oximeter. MAKE SURE YOU:   Understand these instructions.  Will watch your condition.  Will get help right away if you are not doing well or get worse. Document Released: 06/26/2005 Document Revised: 07/07/2013 Document Reviewed: 05/13/2013 [facility] Patient Information 2014 [place], [place].

## 2013-11-17 NOTE — Progress Notes (Signed)
Subjective:    Patient ID: [name] [name], female    DOB: 07/13/1923, 80 y.o.   MRN: [id number]  Cough This is a new problem. The current episode started in the past 7 days. The problem has been gradually worsening. The problem occurs every few minutes. The cough is productive of sputum. Pertinent negatives include no chest pain, chills, ear congestion, ear pain, fever, headaches, hemoptysis, myalgias, nasal congestion, postnasal drip, rash, rhinorrhea, sore throat, shortness of breath or sweats.  This morning she used her HHN duoneb twice with improvement    Review of Systems  Constitutional: Negative for fever, chills, diaphoresis, activity change, appetite change and fatigue.  HENT: Positive for congestion. Negative for dental problem, drooling, ear discharge, ear pain, hearing loss, mouth sores, postnasal drip, rhinorrhea, sinus pressure and sore throat.   Eyes: Negative.   Respiratory: Positive for cough and choking. Negative for hemoptysis, chest tightness and shortness of breath.   Cardiovascular: Negative.  Negative for chest pain.  Endocrine: Negative.   Genitourinary: Negative.   Musculoskeletal: Positive for joint swelling. Negative for myalgias.       C/o Rt Knee Pains preventing her from standing. Has deen Dr Magnus Ivan and had steroid and ? Supars injections w/o benefit.  Skin: Negative for rash.  Allergic/Immunologic: Negative.   Neurological: Negative.  Negative for headaches.   Current Outpatient Prescriptions on File Prior to Visit  Medication Sig Dispense Refill  . acetaminophen-codeine (TYLENOL #3) 300-30 MG per tablet Take by mouth every 4 (four) hours as needed for moderate pain.      Marland Kitchen aspirin 81 MG tablet Take 162 mg by mouth daily.      Marland Kitchen BIOTIN PO Take 500 mcg by mouth.      . bumetanide (BUMEX) 1 MG tablet Take 1 mg by mouth daily.      . Cholecalciferol (VITAMIN D-3 PO) Take 4,000 Units by mouth daily.       . clorazepate (TRANXENE) 7.5 MG tablet take 1/2 to  1 tablet by mouth three times a day if needed for anxiety or sleep  90 tablet  0  . diltiazem (CARDIZEM CD) 120 MG 24 hr capsule Take 1 capsule (120 mg total) by mouth daily.  30 capsule  6  . famotidine (PEPCID) 40 MG tablet TAKE ONE TABLET BY MOUTH ONCE DAILY  90 tablet  0  . MAGNESIUM PO Take 1 tablet by mouth daily.      . meloxicam (MOBIC) 7.5 MG tablet Take 7.5 mg by mouth 2 (two) times daily.      Marland Kitchen OVER THE COUNTER MEDICATION as needed. Stool softner      . pravastatin (PRAVACHOL) 40 MG tablet Take 40 mg by mouth daily.       No current facility-administered medications on file prior to visit.   Allergies  Allergen Reactions  . Advil [Ibuprofen]     dyspepsia    has a past medical history of B12 deficiency; HTN (hypertension); DDD (degenerative disc disease); Supraventricular tachycardia; Iron deficiency anemia; HLD (hyperlipidemia); Osteoporosis; Chronic anticoagulation; CAD (coronary artery disease); Choledocholithiasis; History of pancreatitis; Diverticulosis; Hiatal hernia; adenomatous colonic polyps; and Pulmonary emboli (2007).  Objective:   Physical Exam  Constitutional: She is oriented to person, place, and time. No distress.  Overnourished elderly white female sitting in a wheelchair.  HENT:  Right Ear: External ear normal.  Left Ear: External ear normal.  No Frontal or Maxillary tenderness  Eyes: EOM are normal. Pupils are equal, round, and reactive  to light.  Neck: Neck supple. No JVD present. No thyromegaly present.  Cardiovascular: Normal rate, regular rhythm and normal heart sounds.  Exam reveals no gallop.   No murmur heard. Pulmonary/Chest: No stridor. No respiratory distress. She has no wheezes. She has rales. She exhibits no tenderness.  O2 sat 91-92 %.   Abdominal: Soft. Bowel sounds are normal.  Musculoskeletal: Normal range of motion.  Lymphadenopathy:    She has no cervical adenopathy.  Neurological: She is alert and oriented to person, place, and  time.  Skin: Skin is warm and dry. No rash noted. No erythema. No pallor.  Psychiatric: She has a normal mood and affect.    Assessment & Plan:  1. Bronchitis, chronic obstructive, with exacerbation  Rx Z Pak, Prednisone pulse/taper and Norco 5 for pain

## 2013-11-22 ENCOUNTER — Encounter: Payer: Self-pay | Admitting: Internal Medicine

## 2013-11-22 ENCOUNTER — Ambulatory Visit (INDEPENDENT_AMBULATORY_CARE_PROVIDER_SITE_OTHER): Payer: Medicare Other | Admitting: Internal Medicine

## 2013-11-22 VITALS — BP 126/82 | HR 80 | Temp 98.2°F | Resp 16

## 2013-11-22 DIAGNOSIS — K5732 Diverticulitis of large intestine without perforation or abscess without bleeding: Secondary | ICD-10-CM

## 2013-11-22 MED ORDER — CIPROFLOXACIN HCL 500 MG PO TABS: 500.0000 mg | ORAL_TABLET | Freq: Two times a day (BID) | ORAL | Status: AC

## 2013-11-22 MED ORDER — METRONIDAZOLE 500 MG PO TABS: 500.0000 mg | ORAL_TABLET | Freq: Three times a day (TID) | ORAL | Status: AC

## 2013-11-22 NOTE — Progress Notes (Signed)
   Subjective:    Patient ID: [name] [name], female    DOB: 11/09/1922, 80 y.o.   MRN: [id number]  HPI  This nice 80 yo MWF was just seen and treated for AECB/COPD 5 days ago and Tx'd with a Z-Pak/Prednisone taper presents now with a 1-2 day Hx/o LLQ pains and cramping w/o N/V/D but does relate chronic constipation. She denies fever and chills Scheduled Meds: Continuous Infusions: PRN Meds:.   Allergies  Allergen Reactions  . Advil [Ibuprofen]     dyspepsia   Past Medical History  Diagnosis Date  . B12 deficiency   . HTN (hypertension)   . DDD (degenerative disc disease)   . Supraventricular tachycardia   . Iron deficiency anemia   . HLD (hyperlipidemia)   . Osteoporosis   . Chronic anticoagulation   . CAD (coronary artery disease)     CFX PTCA '96,LAD stent in '05, cath '07  . Choledocholithiasis   . History of pancreatitis   . Diverticulosis   . Hiatal hernia   . Hx of adenomatous colonic polyps   . Pulmonary emboli 2007    Review of Systems  Constitutional: Negative.  Negative for fever, chills, diaphoresis and appetite change.  HENT: Negative.   Eyes: Negative.   Respiratory: Negative.   Cardiovascular: Negative.   Gastrointestinal: Positive for abdominal pain and constipation. Negative for nausea, vomiting, diarrhea, blood in stool, abdominal distention, anal bleeding and rectal pain.       C/o LLQ cramping and pain  Endocrine: Negative.   Genitourinary: Negative.   Musculoskeletal: Positive for back pain and gait problem. Negative for myalgias, neck pain and neck stiffness.  Allergic/Immunologic: Negative.   Neurological: Negative.     Objective:   Physical Exam  Constitutional: She is oriented to person, place, and time. No distress.  Over nourished  HENT:  Head: Normocephalic.  Mouth/Throat: Oropharynx is clear and moist. No oropharyngeal exudate.  Eyes: EOM are normal. Pupils are equal, round, and reactive to light.  Neck: Normal range of motion. Neck  supple. No thyromegaly present.  Cardiovascular: Normal rate, regular rhythm and normal heart sounds.   No murmur heard. Pulmonary/Chest: Effort normal and breath sounds normal. No respiratory distress. She has no wheezes. She has no rales.  Abdominal: Soft. Bowel sounds are normal. She exhibits no distension and no mass. There is tenderness. There is no rebound and no guarding.  LLQ tender  Musculoskeletal: Normal range of motion.  In a wheelchair  Lymphadenopathy:    She has no cervical adenopathy.  Neurological: She is alert and oriented to person, place, and time. No cranial nerve deficit.  Skin: Skin is warm and dry. No rash noted. She is not diaphoretic. No erythema.    Assessment & Plan:  1. Diverticulitis - ciprofloxacin (CIPRO) 500 MG tablet; Take 1 tablet (500 mg total) by mouth 2 (two) times daily. With food for infection  Dispense: 20 tablet; Refill: 1 - metroNIDAZOLE (FLAGYL) 500 MG tablet; Take 1 tablet (500 mg total) by mouth 3 (three) times daily. With food for infection  Dispense: 30 tablet; Refill: 1  Diet discussed ROV 2 weeks for quarterly labs

## 2013-11-22 NOTE — Patient Instructions (Signed)
Diverticulitis °A diverticulum is a small pouch or sac on the colon. Diverticulosis is the presence of these diverticula on the colon. Diverticulitis is the irritation (inflammation) or infection of diverticula. °CAUSES  °The colon and its diverticula contain bacteria. If food particles block the tiny opening to a diverticulum, the bacteria inside can grow and cause an increase in pressure. This leads to infection and inflammation and is called diverticulitis. °SYMPTOMS  °· Abdominal pain and tenderness. Usually, the pain is located on the left side of your abdomen. However, it could be located elsewhere. °· Fever. °· Bloating. °· Feeling sick to your stomach (nausea). °· Throwing up (vomiting). °· Abnormal stools. °DIAGNOSIS  °Your caregiver will take a history and perform a physical exam. Since many things can cause abdominal pain, other tests may be necessary. Tests may include: °· Blood tests. °· Urine tests. °· X-ray of the abdomen. °· CT scan of the abdomen. °Sometimes, surgery is needed to determine if diverticulitis or other conditions are causing your symptoms. °TREATMENT  °Most of the time, you can be treated without surgery. Treatment includes: °· Resting the bowels by only having liquids for a few days. As you improve, you will need to eat a low-fiber diet. °· Intravenous (IV) fluids if you are losing body fluids (dehydrated). °· Antibiotic medicines that treat infections may be given. °· Pain and nausea medicine, if needed. °· Surgery if the inflamed diverticulum has burst. °HOME CARE INSTRUCTIONS  °· Try a clear liquid diet (broth, tea, or water for as long as directed by your caregiver). You may then gradually begin a low-fiber diet as tolerated.  °A low-fiber diet is a diet with less than 10 grams of fiber. Choose the foods below to reduce fiber in the diet: °· White breads, cereals, rice, and pasta. °· Cooked fruits and vegetables or soft fresh fruits and vegetables without the skin. °· Ground or  well-cooked tender beef, ham, veal, lamb, pork, or poultry. °· Eggs and seafood. °· After your diverticulitis symptoms have improved, your caregiver may put you on a high-fiber diet. A high-fiber diet includes 14 grams of fiber for every 1000 calories consumed. For a standard 2000 calorie diet, you would need 28 grams of fiber. Follow these diet guidelines to help you increase the fiber in your diet. It is important to slowly increase the amount fiber in your diet to avoid gas, constipation, and bloating. °· Choose whole-grain breads, cereals, pasta, and brown rice. °· Choose fresh fruits and vegetables with the skin on. Do not overcook vegetables because the more vegetables are cooked, the more fiber is lost. °· Choose more nuts, seeds, legumes, dried peas, beans, and lentils. °· Look for food products that have greater than 3 grams of fiber per serving on the Nutrition Facts label. °· Take all medicine as directed by your caregiver. °· If your caregiver has given you a follow-up appointment, it is very important that you go. Not going could result in lasting (chronic) or permanent injury, pain, and disability. If there is any problem keeping the appointment, call to reschedule. °SEEK MEDICAL CARE IF:  °· Your pain does not improve. °· You have a hard time advancing your diet beyond clear liquids. °· Your bowel movements do not return to normal. °SEEK IMMEDIATE MEDICAL CARE IF:  °· Your pain becomes worse. °· You have an oral temperature above 102° F (38.9° C), not controlled by medicine. °· You have repeated vomiting. °· You have bloody or black, tarry stools. °·   Symptoms that brought you to your caregiver become worse or are not getting better. °MAKE SURE YOU:  °· Understand these instructions. °· Will watch your condition. °· Will get help right away if you are not doing well or get worse. °Document Released: 06/26/2005 Document Revised: 12/09/2011 Document Reviewed: 10/22/2010 °[employer]® Patient Information  ©2014 [employer]. ° °

## 2013-11-23 ENCOUNTER — Ambulatory Visit: Payer: Medicare Other | Admitting: Cardiovascular Disease

## 2013-11-23 ENCOUNTER — Ambulatory Visit: Payer: Self-pay | Admitting: Internal Medicine

## 2013-11-24 ENCOUNTER — Ambulatory Visit: Payer: Self-pay | Admitting: Internal Medicine

## 2013-11-29 ENCOUNTER — Telehealth: Payer: Self-pay | Admitting: *Deleted

## 2013-11-29 NOTE — Telephone Encounter (Signed)
Patient's spouse called and states recent meds (Cipro and Flagyl) may be causing dark urine and light-headed feeling. He states patient has decreased appetite and rash on back.  Per Dr Oneta RackMcKeown, increase fluid intake and be sure to take meds with food.  Patient can try cortisone cream for rash and Benadryl for itching.  Advised spouse to call back if patient does not improve.

## 2013-12-02 ENCOUNTER — Telehealth: Payer: Self-pay | Admitting: *Deleted

## 2013-12-02 MED ORDER — DICYCLOMINE HCL 20 MG PO TABS: 20.0000 mg | ORAL_TABLET | Freq: Three times a day (TID) | ORAL | Status: DC | PRN

## 2013-12-02 NOTE — Telephone Encounter (Signed)
Spoke with spouse.  He states patient finished with Cipro and Flagyl and still having abdominal pain.  [facility]  Dicyclomine.  Patient has an appointment with Dr Oneta RackMcKeown next week.

## 2013-12-08 ENCOUNTER — Ambulatory Visit: Payer: Self-pay | Admitting: Internal Medicine

## 2013-12-13 ENCOUNTER — Encounter: Payer: Self-pay | Admitting: Cardiovascular Disease

## 2013-12-13 ENCOUNTER — Ambulatory Visit (INDEPENDENT_AMBULATORY_CARE_PROVIDER_SITE_OTHER): Payer: Medicare Other | Admitting: Cardiovascular Disease

## 2013-12-13 VITALS — BP 116/60 | HR 71 | Resp 16 | Ht 66.0 in | Wt 187.9 lb

## 2013-12-13 DIAGNOSIS — I2510 Atherosclerotic heart disease of native coronary artery without angina pectoris: Secondary | ICD-10-CM

## 2013-12-13 NOTE — Patient Instructions (Signed)
Your physician recommends that you schedule a follow-up appointment in: 12 months with Dr.Croitoru  

## 2013-12-19 ENCOUNTER — Encounter: Payer: Self-pay | Admitting: Cardiovascular Disease

## 2013-12-19 NOTE — Progress Notes (Signed)
Patient ID: [name] [name], female   DOB: 1923/07/24, 80 y.o.   MRN: [id number]     Reason for office visit CAD  Mrs. Ruffalo has a remote history of multiple coronary problems but she has not had any events in the last 10 years. She received a stent to the LAD in 1996 and then a drug-eluting stent in the same area in 2005. She underwent angioplasty to the oblique marginal artery in 2004. She has not had a cardiac catheterization since 2007, when the angiograms showed no important stenoses. She also has a remote history of pulmonary embolism that occurred in 2007, from a DVT that was possibly related to her cardiac catheterization. She has had multiple falls and her warfarin has been stopped.  She has no cardiac complaints. Biggest problem is "that she can't walk". She has fallen repeatedly, although not recently. I'm not sure what the cause is but she has numerous possible etiologies including lumbar spine disease.   Allergies  Allergen Reactions  . Advil [Ibuprofen]     dyspepsia    Current Outpatient Prescriptions  Medication Sig Dispense Refill  . acetaminophen-codeine (TYLENOL #3) 300-30 MG per tablet Take by mouth every 4 (four) hours as needed for moderate pain.      Marland Kitchen aspirin 81 MG tablet Take 162 mg by mouth daily.      Marland Kitchen BIOTIN PO Take 500 mcg by mouth.      . bumetanide (BUMEX) 1 MG tablet Take 1 mg by mouth daily.      . Cholecalciferol (VITAMIN D-3 PO) Take 4,000 Units by mouth daily.       . clorazepate (TRANXENE) 7.5 MG tablet take 1/2 to 1 tablet by mouth three times a day if needed for anxiety or sleep  90 tablet  0  . dicyclomine (BENTYL) 20 MG tablet Take 1 tablet (20 mg total) by mouth 3 (three) times daily as needed for spasms.  30 tablet  0  . diltiazem (CARDIZEM CD) 120 MG 24 hr capsule Take 1 capsule (120 mg total) by mouth daily.  30 capsule  6  . famotidine (PEPCID) 40 MG tablet TAKE ONE TABLET BY MOUTH ONCE DAILY  90 tablet  0  . HYDROcodone-acetaminophen (NORCO)  5-325 MG per tablet 1/2 to 1 tablet every 3 to 4 hours as needed for cough or pain  50 tablet  0  . MAGNESIUM PO Take 1 tablet by mouth daily.      . meloxicam (MOBIC) 7.5 MG tablet Take 7.5 mg by mouth 2 (two) times daily.      Marland Kitchen OVER THE COUNTER MEDICATION as needed. Stool softner      . pravastatin (PRAVACHOL) 40 MG tablet Take 40 mg by mouth daily.       No current facility-administered medications for this visit.    Past Medical History  Diagnosis Date  . B12 deficiency   . HTN (hypertension)   . DDD (degenerative disc disease)   . Supraventricular tachycardia   . Iron deficiency anemia   . HLD (hyperlipidemia)   . Osteoporosis   . Chronic anticoagulation   . CAD (coronary artery disease)     CFX PTCA '96,LAD stent in '05, cath '07  . Choledocholithiasis   . History of pancreatitis   . Diverticulosis   . Hiatal hernia   . Hx of adenomatous colonic polyps   . Pulmonary emboli 2007    Past Surgical History  Procedure Laterality Date  . Kyphosis surgery    .  Vertebroplasty  2007  . Laminectomy    . Cholecystectomy  2007  . Cardiac catheterization  '96, 8/04,11/05, 1/07  . Ercp with sphincterotomy  2007  . Thyroidectomy, partial  1950    goiter  . Dilation and curettage of uterus  1955  . Cystoscopy  1977  . Abdominal hysterectomy    . Knee arthroscopy Right 2001    Dr. Ollen Bowl    Family History  Problem Relation Age of Onset  . Stroke Father   . Stroke Brother   . Cancer Brother     prostate  . Diabetes Son   . Colon cancer Neg Hx     History   Social History  . Marital Status: Married    Spouse Name: Chase Picket    Number of Children: 2  . Years of Education: N/A   Occupational History  . retired    Social History Main Topics  . Smoking status: Never Smoker   . Smokeless tobacco: Never Used  . Alcohol Use: No  . Drug Use: No  . Sexual Activity: Not on file   Other Topics Concern  . Not on file   Social History Narrative  . No narrative on  file    Review of systems: The patient specifically denies any chest pain at rest exertion, dyspnea at rest or with exertion, orthopnea, paroxysmal nocturnal dyspnea, syncope, palpitations, focal neurological deficits, intermittent claudication, lower extremity edema, unexplained weight gain, cough, hemoptysis or wheezing.   PHYSICAL EXAM BP 116/60  Pulse 71  Resp 16  Ht 5\' 6"  (1.676 m)  Wt 85.231 kg (187 lb 14.4 oz)  BMI 30.34 kg/m2 General: Alert, oriented x3, no distress  Head: no evidence of trauma, PERRL, EOMI, no exophtalmos or lid lag, no myxedema, no xanthelasma; normal ears, nose and oropharynx  Neck: normal jugular venous pulsations and no hepatojugular reflux; brisk carotid pulses without delay and no carotid bruits  Chest: clear to auscultation, no signs of consolidation by percussion or palpation, normal fremitus, symmetrical and full respiratory excursions  Cardiovascular: normal position and quality of the apical impulse, regular rhythm, normal first and second heart sounds, no murmurs, rubs or gallops  Abdomen: no tenderness or distention, no masses by palpation, no abnormal pulsatility or arterial bruits, normal bowel sounds, no hepatosplenomegaly  Extremities: wearing calf orthopedic device ; no clubbing, cyanosis or edema; 2+ radial, ulnar and brachial pulses bilaterally; 2+ right femoral, posterior tibial and dorsalis pedis pulses; 2+ left femoral, posterior tibial and dorsalis pedis pulses; no subclavian or femoral bruits  Neurological: grossly nonfocal   EKG: Sinus rhythm, delayed anterior R wave progression, cannot exclude old septal infarction   Lipid Panel     Component Value Date/Time   CHOL 149 10/21/2013 1427   TRIG 95 10/21/2013 1427   HDL 61 10/21/2013 1427   CHOLHDL 2.4 10/21/2013 1427   VLDL 19 10/21/2013 1427   LDLCALC 69 10/21/2013 1427    BMET    Component Value Date/Time   NA 139 10/21/2013 1427   K 4.9 10/21/2013 1427   CL 99 10/21/2013 1427    CO2 34* 10/21/2013 1427   GLUCOSE 100* 10/21/2013 1427   BUN 18 10/21/2013 1427   CREATININE 0.83 10/21/2013 1427   CREATININE 0.81 03/05/2013 0535   CALCIUM 9.6 10/21/2013 1427   GFRNONAA 63* 03/05/2013 0535   GFRAA 74* 03/05/2013 0535     ASSESSMENT AND PLAN  At this point Mrs. Dipierro does not have any symptoms of active coronary or  cardiac illness. There are no signs of congestive heart failure. She has appropriately treated hyperlipidemia and a normal blood pressure. No changes are made to her medications. I recommend that she remain off anticoagulants.  Orders Placed This Encounter  Procedures  . EKG 12-Lead   Patient Instructions  Your physician recommends that you schedule a follow-up appointment in: 12 months with Dr.Natisha Trzcinski     Draycen Leichter  Thurmon FairMihai Fern Canova, MD, [facility] [phone] office [phone] pager

## 2013-12-28 ENCOUNTER — Telehealth: Payer: Self-pay | Admitting: Internal Medicine

## 2013-12-28 NOTE — Telephone Encounter (Signed)
Left a message for patient to call me. 

## 2013-12-29 NOTE — Telephone Encounter (Signed)
Spoke with patient's husband and reviewed recommendations by Dr Juanda ChanceBrodie on OV 02/26/13. He will try this and call back if the Miralax or Senekot does not helpl

## 2014-01-05 ENCOUNTER — Encounter: Payer: Self-pay | Admitting: Internal Medicine

## 2014-01-05 ENCOUNTER — Ambulatory Visit (INDEPENDENT_AMBULATORY_CARE_PROVIDER_SITE_OTHER): Payer: Medicare Other | Admitting: Internal Medicine

## 2014-01-05 VITALS — BP 120/84 | HR 72 | Temp 97.7°F | Resp 16 | Ht 62.0 in | Wt 191.2 lb

## 2014-01-05 DIAGNOSIS — Z1212 Encounter for screening for malignant neoplasm of rectum: Secondary | ICD-10-CM

## 2014-01-05 DIAGNOSIS — R7303 Prediabetes: Secondary | ICD-10-CM

## 2014-01-05 DIAGNOSIS — E782 Mixed hyperlipidemia: Secondary | ICD-10-CM

## 2014-01-05 DIAGNOSIS — Z0000 Encounter for general adult medical examination without abnormal findings: Secondary | ICD-10-CM

## 2014-01-05 DIAGNOSIS — I10 Essential (primary) hypertension: Secondary | ICD-10-CM | POA: Insufficient documentation

## 2014-01-05 DIAGNOSIS — Z789 Other specified health status: Secondary | ICD-10-CM

## 2014-01-05 DIAGNOSIS — E559 Vitamin D deficiency, unspecified: Secondary | ICD-10-CM

## 2014-01-05 DIAGNOSIS — Z1331 Encounter for screening for depression: Secondary | ICD-10-CM

## 2014-01-05 DIAGNOSIS — Z79899 Other long term (current) drug therapy: Secondary | ICD-10-CM | POA: Insufficient documentation

## 2014-01-05 DIAGNOSIS — E1129 Type 2 diabetes mellitus with other diabetic kidney complication: Secondary | ICD-10-CM | POA: Insufficient documentation

## 2014-01-05 LAB — CBC WITH DIFFERENTIAL/PLATELET
Basophils Absolute: 0 10*3/uL (ref 0.0–0.1)
Basophils Relative: 0 % (ref 0–1)
EOS ABS: 0.2 10*3/uL (ref 0.0–0.7)
EOS PCT: 2 % (ref 0–5)
HEMATOCRIT: 43.8 % (ref 36.0–46.0)
Hemoglobin: 14.2 g/dL (ref 12.0–15.0)
Lymphocytes Relative: 20 % (ref 12–46)
Lymphs Abs: 2.3 10*3/uL (ref 0.7–4.0)
MCH: 27.7 pg (ref 26.0–34.0)
MCHC: 32.4 g/dL (ref 30.0–36.0)
MCV: 85.5 fL (ref 78.0–100.0)
Monocytes Absolute: 1.3 10*3/uL — ABNORMAL HIGH (ref 0.1–1.0)
Monocytes Relative: 11 % (ref 3–12)
Neutro Abs: 7.8 10*3/uL — ABNORMAL HIGH (ref 1.7–7.7)
Neutrophils Relative %: 67 % (ref 43–77)
Platelets: 234 10*3/uL (ref 150–400)
RBC: 5.12 MIL/uL — AB (ref 3.87–5.11)
RDW: 15.4 % (ref 11.5–15.5)
WBC: 11.6 10*3/uL — ABNORMAL HIGH (ref 4.0–10.5)

## 2014-01-05 NOTE — Progress Notes (Signed)
Patient ID: [name] [name], female   DOB: 1923-08-31, 80 y.o.   MRN: [id number]   Annual  Comprehensive Examination  This very nice 80 y.o. MWF presents for complete physical.  Patient has been followed for HTN, Diabetes  Prediabetes, Hyperlipidemia, and Vitamin D Deficiency.    HTN predates since 1996. Today's BP: 120/84 mmHg. Patient has ASCAD and had a LAD Stent in 1996, DES in 2005  PTCA to the OM in 2004. Patient denies any cardiac symptoms as chest pain, palpitations, shortness of breath, dizziness or ankle swelling.   Patient's hyperlipidemia is controlled with diet and medications. Patient denies myalgias or other medication SE's. Last cholesterol last visit was 149, triglycerides 95, HDL 61 and LDL 69 in Jan 2015 - all at goal.     Patient has T2 NIDDM with A1c 6.5% in 2008 and Stage II CKD (GFR 63 ml/min) and  last A1c 6.1% in Oct 2014. Patient denies reactive hypoglycemic symptoms, visual blurring, diabetic polys, or paresthesias.    Finally, patient has history of Vitamin D Deficiency of 9033 in 2009 with last vitamin D of 53 in July 2014.  Medication Sig  . aspirin 81 MG tablet Take 162 mg by mouth daily.  . bumetanide (BUMEX) 1 MG tablet Take 1 mg by mouth daily.  . Cholecalciferol (VITAMIN D-3 PO) Take 4,000 Units by mouth daily.   . TranZENE 7.5 mg take 1/2 to 1 tablet by mouth three times a day if needed for anxiety or sleep  . diltiazem (CARDIZEM CD) 120 MG  Take 1 capsule (120 mg total) by mouth daily.  . famotidine (PEPCID) 40 MG tablet TAKE ONE TABLET BY MOUTH ONCE DAILY  .  NORCO  5-325 MG per tablet 1/2 to 1 tablet every 3 to 4 hours as needed for cough or pain  . MAGNESIUM PO Take 1 tablet by mouth daily.  . meloxicam (MOBIC) 7.5 MG tablet Take 7.5 mg by mouth 2 (two) times daily.  . pravastatin (PRAVACHOL) 40 MG tablet Take 40 mg by mouth daily.   Allergies  Allergen Reactions  . Advil [Ibuprofen]     dyspepsia    Past Medical History  Diagnosis Date  . B12  deficiency   . HTN (hypertension)   . DDD (degenerative disc disease)   . Supraventricular tachycardia   . Iron deficiency anemia   . HLD (hyperlipidemia)   . Osteoporosis   . Chronic anticoagulation   . CAD (coronary artery disease)     CFX PTCA '96,LAD stent in '05, cath '07  . Choledocholithiasis   . History of pancreatitis   . Diverticulosis   . Hiatal hernia   . Hx of adenomatous colonic polyps   . Pulmonary emboli 2007    Past Surgical History  Procedure Laterality Date  . Kyphosis surgery    . Vertebroplasty  2007  . Laminectomy    . Cholecystectomy  2007  . Cardiac catheterization  '96, 8/04,11/05, 1/07  . Ercp with sphincterotomy  2007  . Thyroidectomy, partial  1950    goiter  . Dilation and curettage of uterus  1955  . Cystoscopy  1977  . Abdominal hysterectomy    . Knee arthroscopy Right 2001    Dr. Ollen BowlHarkins    Family History  Problem Relation Age of Onset  . Stroke Father   . Stroke Brother   . Cancer Brother     prostate  . Diabetes Son   . Colon cancer Neg Hx  History  Substance Use Topics  . Smoking status: Never Smoker   . Smokeless tobacco: Never Used  . Alcohol Use: No    ROS Constitutional: Denies fever, chills, weight loss/gain, headaches, insomnia, fatigue, night sweats, and change in appetite. Eyes: Denies redness, blurred vision, diplopia, discharge, itchy, watery eyes.  ENT: Denies discharge, congestion, post nasal drip, epistaxis, sore throat, earache, hearing loss, dental pain, Tinnitus, Vertigo, Sinus pain, snoring.  Cardio: Denies chest pain, palpitations, irregular heartbeat, syncope, dyspnea, diaphoresis, orthopnea, PND, claudication, edema Respiratory: denies cough, dyspnea, DOE, pleurisy, hoarseness, laryngitis, wheezing.  Gastrointestinal: Denies dysphagia, heartburn, reflux, water brash, pain, cramps, nausea, vomiting, bloating, diarrhea, constipation, hematemesis, melena, hematochezia, jaundice, hemorrhoids Genitourinary:  Denies dysuria, frequency, urgency, nocturia, hesitancy, discharge, hematuria, flank pain Breast:Breast lumps, nipple discharge, bleeding.  Musculoskeletal: Patient c/o limiting pains of the Rt Knee and is scheduled to see Dr Berton Lan next month. Pt use a walker in the home and a W/C when out for Dr appts. Skin: Denies puritis, rash, hives, warts, acne, eczema, changing in skin lesion Neuro: No weakness, tremor, incoordination, spasms, paresthesia, pain Psychiatric: Denies confusion, memory loss, sensory loss Endocrine: Denies change in weight, skin, hair change, nocturia, and paresthesia, diabetic polys, visual blurring, hyper / hypo glycemic episodes.  Heme/Lymph: No excessive bleeding, bruising, enlarged lymph nodes.  Physical Exam     BP 120/84  Pulse 72  Temp 97.7 F   Resp 16  Ht 5\' 2"    Wt 191 lb 3.2 oz   BMI 34.96 kg/m2  General Appearance: Over nourished, in no apparent distress. Eyes: PERRLA, EOMs, conjunctiva no swelling or erythema, normal fundi and vessels. ENT/Mouth: EACs patent / TMs  nl. Nares clear without erythema, swelling, mucoid exudates. Oral hygiene is good. No erythema, swelling, or exudate. Tongue normal, non-obstructing. Tonsils not swollen or erythematous. Hearing normal.  Neck: Supple, thyroid normal. No bruits, nodes or JVD. Respiratory: Respiratory effort normal.  BS equal, distant and clear bilateral without rales, rhonci, wheezing or stridor. Cardio: Heart sounds are normal with regular rate and rhythm and no murmurs, rubs or gallops. Peripheral pulses are normal and equal bilaterally without edema. No aortic or femoral bruits. Chest: kyphotic and symmetric with symmetric expansion. Breasts: Symmetric, pendulous without lumps, nipple discharge, retractions, or fibrocystic changes.  Abdomen: Flat, soft, with bowl sounds. Nontender, no guarding, rebound, hernias, masses, or organomegaly.  Musculoskeletal: Muscle power , tone & bulk is decreased from age and  deconditioning and currently in W/C. Skin: Warm and dry without rashes, lesions, cyanosis, clubbing or  ecchymosis.  Neuro: Cranial nerves intact, reflexes equal bilaterally. Normal muscle tone, no cerebellar symptoms. Sensation intact.  Pysch: Awake and oriented X 3, normal affect, Insight and Judgment appropriate.   Assessment and Plan  1. Annual Comprehensive Examination 2. Hypertension  3. Hyperlipidemia 4. T2 NIDDM w/Stage II CKD 5. Vitamin D Deficiency 6. ASCAD S/P PTCA x 3 7. Morbid Obesity (BMI 35) 8. DJD, Rt Knee   Continue prudent diet as discussed, weight control, BP monitoring, regular exercise, and medications. Discussed med's effects and SE's. Screening labs and tests as requested with regular follow-up as recommended.

## 2014-01-05 NOTE — Patient Instructions (Signed)

## 2014-01-06 LAB — HEPATIC FUNCTION PANEL
ALBUMIN: 3.6 g/dL (ref 3.5–5.2)
ALK PHOS: 56 U/L (ref 39–117)
ALT: 10 U/L (ref 0–35)
AST: 12 U/L (ref 0–37)
BILIRUBIN DIRECT: 0.1 mg/dL (ref 0.0–0.3)
Indirect Bilirubin: 0.3 mg/dL (ref 0.2–1.2)
Total Bilirubin: 0.4 mg/dL (ref 0.2–1.2)
Total Protein: 5.9 g/dL — ABNORMAL LOW (ref 6.0–8.3)

## 2014-01-06 LAB — BASIC METABOLIC PANEL WITH GFR
BUN: 23 mg/dL (ref 6–23)
CALCIUM: 9.6 mg/dL (ref 8.4–10.5)
CHLORIDE: 103 meq/L (ref 96–112)
CO2: 31 meq/L (ref 19–32)
Creat: 0.81 mg/dL (ref 0.50–1.10)
GFR, Est African American: 75 mL/min
GFR, Est Non African American: 65 mL/min
Glucose, Bld: 98 mg/dL (ref 70–99)
Potassium: 5.1 mEq/L (ref 3.5–5.3)
Sodium: 142 mEq/L (ref 135–145)

## 2014-01-06 LAB — MAGNESIUM: Magnesium: 2 mg/dL (ref 1.5–2.5)

## 2014-01-06 LAB — LIPID PANEL
CHOL/HDL RATIO: 2.4 ratio
Cholesterol: 135 mg/dL (ref 0–200)
HDL: 56 mg/dL (ref >39)
LDL CALC: 64 mg/dL (ref 0–99)
Triglycerides: 76 mg/dL (ref <150)
VLDL: 15 mg/dL (ref 0–40)

## 2014-01-06 LAB — HEMOGLOBIN A1C
HEMOGLOBIN A1C: 6 % — AB (ref <5.7)
Mean Plasma Glucose: 126 mg/dL — ABNORMAL HIGH (ref <117)

## 2014-01-06 LAB — INSULIN, FASTING: Insulin fasting, serum: 13 u[IU]/mL (ref 3–28)

## 2014-01-06 LAB — VITAMIN D 25 HYDROXY (VIT D DEFICIENCY, FRACTURES): VIT D 25 HYDROXY: 79 ng/mL (ref 30–89)

## 2014-01-06 LAB — TSH: TSH: 2.232 u[IU]/mL (ref 0.350–4.500)

## 2014-01-07 LAB — URINALYSIS, MICROSCOPIC ONLY
CASTS: NONE SEEN
Crystals: NONE SEEN

## 2014-01-07 LAB — MICROALBUMIN / CREATININE URINE RATIO
Creatinine, Urine: 176.6 mg/dL
Microalb Creat Ratio: 8.7 mg/g (ref 0.0–30.0)
Microalb, Ur: 1.54 mg/dL (ref 0.00–1.89)

## 2014-01-12 ENCOUNTER — Telehealth: Payer: Self-pay | Admitting: *Deleted

## 2014-01-12 NOTE — Telephone Encounter (Signed)
Spouse called, He says patient sick with a cold and dry cough, but no fever.  Per Dr Oneta RackMcKeown, try OTC Delsym and dayquil.  Spouse aware.

## 2014-01-18 ENCOUNTER — Telehealth: Payer: Self-pay | Admitting: *Deleted

## 2014-01-18 ENCOUNTER — Encounter (HOSPITAL_COMMUNITY): Payer: Self-pay | Admitting: Emergency Medicine

## 2014-01-18 ENCOUNTER — Emergency Department (HOSPITAL_COMMUNITY): Payer: Medicare Other

## 2014-01-18 ENCOUNTER — Emergency Department (HOSPITAL_COMMUNITY)
Admission: EM | Admit: 2014-01-18 | Discharge: 2014-01-18 | Disposition: A | Discharge status: Home / Self Care | Payer: Medicare Other | Attending: Emergency Medicine | Admitting: Emergency Medicine

## 2014-01-18 ENCOUNTER — Other Ambulatory Visit: Payer: Self-pay | Admitting: Emergency Medicine

## 2014-01-18 DIAGNOSIS — Z7982 Long term (current) use of aspirin: Secondary | ICD-10-CM | POA: Insufficient documentation

## 2014-01-18 DIAGNOSIS — R059 Cough, unspecified: Secondary | ICD-10-CM

## 2014-01-18 DIAGNOSIS — Z860100 Personal history of colon polyps, unspecified: Secondary | ICD-10-CM | POA: Insufficient documentation

## 2014-01-18 DIAGNOSIS — F29 Unspecified psychosis not due to a substance or known physiological condition: Secondary | ICD-10-CM | POA: Insufficient documentation

## 2014-01-18 DIAGNOSIS — Z86711 Personal history of pulmonary embolism: Secondary | ICD-10-CM | POA: Insufficient documentation

## 2014-01-18 DIAGNOSIS — IMO0002 Reserved for concepts with insufficient information to code with codable children: Secondary | ICD-10-CM | POA: Insufficient documentation

## 2014-01-18 DIAGNOSIS — K449 Diaphragmatic hernia without obstruction or gangrene: Secondary | ICD-10-CM | POA: Insufficient documentation

## 2014-01-18 DIAGNOSIS — K5730 Diverticulosis of large intestine without perforation or abscess without bleeding: Secondary | ICD-10-CM | POA: Insufficient documentation

## 2014-01-18 DIAGNOSIS — Z79899 Other long term (current) drug therapy: Secondary | ICD-10-CM | POA: Insufficient documentation

## 2014-01-18 DIAGNOSIS — M810 Age-related osteoporosis without current pathological fracture: Secondary | ICD-10-CM | POA: Insufficient documentation

## 2014-01-18 DIAGNOSIS — R05 Cough: Secondary | ICD-10-CM

## 2014-01-18 DIAGNOSIS — D509 Iron deficiency anemia, unspecified: Secondary | ICD-10-CM | POA: Insufficient documentation

## 2014-01-18 DIAGNOSIS — E538 Deficiency of other specified B group vitamins: Secondary | ICD-10-CM | POA: Insufficient documentation

## 2014-01-18 DIAGNOSIS — E785 Hyperlipidemia, unspecified: Secondary | ICD-10-CM | POA: Insufficient documentation

## 2014-01-18 DIAGNOSIS — I498 Other specified cardiac arrhythmias: Secondary | ICD-10-CM | POA: Insufficient documentation

## 2014-01-18 DIAGNOSIS — R443 Hallucinations, unspecified: Secondary | ICD-10-CM | POA: Insufficient documentation

## 2014-01-18 DIAGNOSIS — I2510 Atherosclerotic heart disease of native coronary artery without angina pectoris: Secondary | ICD-10-CM | POA: Insufficient documentation

## 2014-01-18 DIAGNOSIS — K8050 Calculus of bile duct without cholangitis or cholecystitis without obstruction: Secondary | ICD-10-CM | POA: Insufficient documentation

## 2014-01-18 DIAGNOSIS — Z8719 Personal history of other diseases of the digestive system: Secondary | ICD-10-CM | POA: Insufficient documentation

## 2014-01-18 DIAGNOSIS — I10 Essential (primary) hypertension: Secondary | ICD-10-CM | POA: Insufficient documentation

## 2014-01-18 DIAGNOSIS — R410 Disorientation, unspecified: Secondary | ICD-10-CM

## 2014-01-18 LAB — CBC
HCT: 42.8 % (ref 36.0–46.0)
HEMOGLOBIN: 14 g/dL (ref 12.0–15.0)
MCH: 28.4 pg (ref 26.0–34.0)
MCHC: 32.7 g/dL (ref 30.0–36.0)
MCV: 86.8 fL (ref 78.0–100.0)
Platelets: 226 10*3/uL (ref 150–400)
RBC: 4.93 MIL/uL (ref 3.87–5.11)
RDW: 15.3 % (ref 11.5–15.5)
WBC: 9.5 10*3/uL (ref 4.0–10.5)

## 2014-01-18 LAB — URINE MICROSCOPIC-ADD ON

## 2014-01-18 LAB — URINALYSIS, ROUTINE W REFLEX MICROSCOPIC
Glucose, UA: NEGATIVE mg/dL
HGB URINE DIPSTICK: NEGATIVE
KETONES UR: NEGATIVE mg/dL
Nitrite: NEGATIVE
PROTEIN: NEGATIVE mg/dL
Specific Gravity, Urine: 1.026 (ref 1.005–1.030)
Urobilinogen, UA: 0.2 mg/dL (ref 0.0–1.0)
pH: 6.5 (ref 5.0–8.0)

## 2014-01-18 LAB — COMPREHENSIVE METABOLIC PANEL
ALK PHOS: 53 U/L (ref 39–117)
ALT: 10 U/L (ref 0–35)
AST: 14 U/L (ref 0–37)
Albumin: 3.5 g/dL (ref 3.5–5.2)
BUN: 24 mg/dL — ABNORMAL HIGH (ref 6–23)
CHLORIDE: 101 meq/L (ref 96–112)
CO2: 27 meq/L (ref 19–32)
Calcium: 9.8 mg/dL (ref 8.4–10.5)
Creatinine, Ser: 0.81 mg/dL (ref 0.50–1.10)
GFR, EST AFRICAN AMERICAN: 73 mL/min — AB (ref >90)
GFR, EST NON AFRICAN AMERICAN: 63 mL/min — AB (ref >90)
GLUCOSE: 108 mg/dL — AB (ref 70–99)
POTASSIUM: 4.4 meq/L (ref 3.7–5.3)
SODIUM: 140 meq/L (ref 137–147)
Total Bilirubin: 0.5 mg/dL (ref 0.3–1.2)
Total Protein: 6.7 g/dL (ref 6.0–8.3)

## 2014-01-18 LAB — TSH: TSH: 1.77 u[IU]/mL (ref 0.350–4.500)

## 2014-01-18 LAB — MAGNESIUM: Magnesium: 2.1 mg/dL (ref 1.5–2.5)

## 2014-01-18 MED ORDER — CLORAZEPATE DIPOTASSIUM 7.5 MG PO TABS: ORAL_TABLET | ORAL | Status: DC

## 2014-01-18 NOTE — ED Notes (Addendum)
Pt reports productive cough x 1 week with yellow sputum. Husband reports pt has been hallucinating. Pt given hydrocodone for cough 2 weeks ago, began hallucinating after starting medications. Pt stopped taking hydrocodone on Friday, but continues to have hallucinations. Family MD told family to come here to r/o stroke. Pt has no neuro deficits.

## 2014-01-18 NOTE — ED Provider Notes (Signed)
I saw and evaluated the patient, reviewed the resident's note and I agree with the findings and plan.  No acute findings noted on labs, CT scan.  Pt does have some issues with her memory while in the ED.  Forgot that she was seen by an MD earlier, her husband had to remind her.  May have a component of dementia but does not appear delirious.  At this time there does not appear to be any evidence of an acute emergency medical condition and the patient appears stable for discharge with appropriate outpatient follow up.   [name] KrasJon R Mallisa [place], MD 01/18/14 2121

## 2014-01-18 NOTE — Telephone Encounter (Signed)
Spouse called and states patient having confusion and hallucinations.  Patient has stopped hydrocodone she was taking for cough four days ago and still confused. Per Dr Oneta RackMcKeown, patient should be evaluated in  the ER for possible infection in lungs or stroke.  Spouse will discuss ER visit with patient.

## 2014-01-18 NOTE — ED Provider Notes (Signed)
CSN: [id number]     Arrival date & time 01/18/14  1434 History   First MD Initiated Contact with Patient 01/18/14 1459     Chief Complaint  Patient presents with  . Cough  . Hallucinations     (Consider location/radiation/quality/duration/timing/severity/associated sxs/prior Treatment) The history is provided by the patient and the spouse.   history of present illness: 80 year old female who presents with chief complaint of cough and "hallucinations". Patient reports onset of cough was several weeks ago. She reports having one week of initial constant coughing. Since that time her cough has improved. She reports that the cough is sometimes productive of yellow sputum. She denies any dyspnea, difficulty breathing, or chest pain. No history of fever. 2 weeks ago she was prescribed hydrocodone for the cough. She stopped taking the hydrocodone 4 days ago do to concern that it may be causing her to have hallucinations. Husband reports one week of intermittent "hallucinations" described as patient being confused as to her location. He says that she has had times when she thought that she was in someone else's home when she was actually at the home they have lived in for the past 50 years. She also thought that there were black cats on the floor but they were actually her husbands shoes. Husband also reports she has been unable to remember where things are in her home. Husband denies any other recent illness or medication changes. He reports that she has a strong family history of dementia in both her parents and a younger sibling. Husband concerned that this could be the onset of dementia.  Past Medical History  Diagnosis Date  . B12 deficiency   . HTN (hypertension)   . DDD (degenerative disc disease)   . Supraventricular tachycardia   . Iron deficiency anemia   . HLD (hyperlipidemia)   . Osteoporosis   . Chronic anticoagulation   . CAD (coronary artery disease)     CFX PTCA '96,LAD stent in '05,  cath '07  . Choledocholithiasis   . History of pancreatitis   . Diverticulosis   . Hiatal hernia   . Hx of adenomatous colonic polyps   . Pulmonary emboli 2007   Past Surgical History  Procedure Laterality Date  . Kyphosis surgery    . Vertebroplasty  2007  . Laminectomy    . Cholecystectomy  2007  . Cardiac catheterization  '96, 8/04,11/05, 1/07  . Ercp with sphincterotomy  2007  . Thyroidectomy, partial  1950    goiter  . Dilation and curettage of uterus  1955  . Cystoscopy  1977  . Abdominal hysterectomy    . Knee arthroscopy Right 2001    Dr. Ollen Bowl   Family History  Problem Relation Age of Onset  . Stroke Father   . Stroke Brother   . Cancer Brother     prostate  . Diabetes Son   . Colon cancer Neg Hx    History  Substance Use Topics  . Smoking status: Never Smoker   . Smokeless tobacco: Never Used  . Alcohol Use: No   OB History   Grav Para Term Preterm Abortions TAB SAB Ect Mult Living                 Review of Systems  Constitutional: Negative for fever and chills.  HENT: Negative for congestion.   Eyes: Negative for pain.  Respiratory: Positive for cough. Negative for shortness of breath.   Cardiovascular: Negative for chest pain.  Gastrointestinal: Negative for  nausea, vomiting, abdominal pain, diarrhea and constipation.  Genitourinary: Negative for dysuria.  Musculoskeletal: Negative for back pain.  Skin: Negative for rash and wound.  Neurological: Negative for headaches.  Psychiatric/Behavioral: Positive for confusion.  All other systems reviewed and are negative.     Allergies  Review of patient's allergies indicates no known allergies.  Home Medications   Prior to Admission medications   Medication Sig Start Date End Date Taking? Authorizing Provider  aspirin 81 MG tablet Take 162 mg by mouth daily.    Historical Provider, MD  bumetanide (BUMEX) 1 MG tablet Take 1 mg by mouth daily.    Historical Provider, MD  Cholecalciferol  (VITAMIN D-3 PO) Take 4,000 Units by mouth daily.     Historical Provider, MD  clorazepate (TRANXENE) 7.5 MG tablet take 1/2 to 1 tablet by mouth three times a day if needed for anxiety or sleep 01/18/14   Melissa R Smith, PA-C  diltiazem (CARDIZEM CD) 120 MG 24 hr capsule Take 1 capsule (120 mg total) by mouth daily. 08/11/13   Mihai Croitoru, MD  famotidine (PEPCID) 40 MG tablet TAKE ONE TABLET BY MOUTH ONCE DAILY    Hart Carwinora M Brodie, MD  HYDROcodone-acetaminophen [facility](NORCO) 5-325 MG per tablet 1/2 to 1 tablet every 3 to 4 hours as needed for cough or pain 11/17/13   Lucky CowboyWilliam McKeown, MD  MAGNESIUM PO Take 1 tablet by mouth daily.    Historical Provider, MD  meloxicam (MOBIC) 7.5 MG tablet Take 7.5 mg by mouth 2 (two) times daily.    Historical Provider, MD  OVER THE COUNTER MEDICATION as needed. Stool softner    Historical Provider, MD  pravastatin (PRAVACHOL) 40 MG tablet Take 40 mg by mouth daily.    Historical Provider, MD   BP 146/64  Pulse 66  Temp(Src) 97.9 F (36.6 C) (Oral)  Resp 19  SpO2 99% Physical Exam  Nursing note and vitals reviewed. Constitutional: She is oriented to person, place, and time. She appears well-developed and well-nourished. No distress.  HENT:  Head: Normocephalic and atraumatic.  Eyes: Conjunctivae are normal.  Neck: Neck supple.  Cardiovascular: Normal rate, regular rhythm, normal heart sounds and intact distal pulses.   Pulmonary/Chest: Effort normal and breath sounds normal. She has no wheezes. She has no rales.  Abdominal: Soft. She exhibits no distension. There is no tenderness.  Musculoskeletal: Normal range of motion.  Neurological: She is alert and oriented to person, place, and time. She has normal strength. No cranial nerve deficit or sensory deficit. Coordination normal. GCS eye subscore is 4. GCS verbal subscore is 5. GCS motor subscore is 6.  Skin: Skin is warm and dry.    ED Course  Procedures (including critical care time) Labs Review Labs  Reviewed  COMPREHENSIVE METABOLIC PANEL - Abnormal; Notable for the following:    Glucose, Bld 108 (*)    BUN 24 (*)    GFR calc non Af Amer 63 (*)    GFR calc Af Amer 73 (*)    All other components within normal limits  URINALYSIS, ROUTINE W REFLEX MICROSCOPIC - Abnormal; Notable for the following:    Bilirubin Urine SMALL (*)    Leukocytes, UA TRACE (*)    All other components within normal limits  URINE MICROSCOPIC-ADD ON - Abnormal; Notable for the following:    Squamous Epithelial / LPF MANY (*)    All other components within normal limits  CBC  MAGNESIUM  TSH    Imaging Review Dg Chest 2  View  01/18/2014   CLINICAL DATA:  Cough.  EXAM: CHEST  2 VIEW  COMPARISON:  Feb 01, 2013.  FINDINGS: Stable elevation of right hemidiaphragm is again noted. Left lung is clear. Cardiomediastinal silhouette appears normal. No pneumothorax or pleural effusion is noted. Bony thorax is intact.  IMPRESSION: No acute cardiopulmonary abnormality seen. Stable elevation of right hemidiaphragm.   Electronically Signed   By: Roque LiasJames  Towson M.D.   On: 01/18/2014 16:13   Ct Head Wo Contrast  01/18/2014   CLINICAL DATA:  Cough, hallucinations  EXAM: CT HEAD WITHOUT CONTRAST  TECHNIQUE: Contiguous axial images were obtained from the base of the skull through the vertex without intravenous contrast.  COMPARISON:  CT HEAD W/O CM dated 03/04/2013  FINDINGS: There is no evidence of mass effect, midline shift, or extra-axial fluid collections. There is no evidence of a space-occupying lesion or intracranial hemorrhage. There is no evidence of a cortical-based area of acute infarction. There is generalized cerebral atrophy. There is periventricular white matter low attenuation likely secondary to microangiopathy.  The ventricles and sulci are appropriate for the patient's age. The basal cisterns are patent.  Visualized portions of the orbits are unremarkable. There is bilateral maxillary sinus air-fluid levels. There is  bilateral ethmoid sinus and sphenoid sinus mucosal thickening. Cerebrovascular atherosclerotic calcifications are noted.  The osseous structures are unremarkable.  IMPRESSION: 1. No acute intracranial pathology. 2. Sinus disease as described above.   Electronically Signed   By: Elige KoHetal  Patel   On: 01/18/2014 16:41     EKG Interpretation None      MDM   Final diagnoses:  Cough  Confusion    Marcelline Deistlta S Northcraft is a 80 y.o. female with PMSH of HTN, CAD, SVT, B12 deficient here with several weeks of a persistent cough and one-week of intermittent confusion. AF VSS. Exam as above. Patient currently alert and oriented to person place time and situation. Unremarkable neurologic exam.  Concern for possible onset of dementia however would also be concerned for organic causes such as electrolytes, UTI, or pneumonia.  Labs/imaging: CBC, CMP, TSH, mag, UA, chest x-ray, CT head.  CT head without any acute intracranial abnormalities. No signs of CVA or mass. Chest x-ray without evidence of pneumonia. Laboratory evaluation unremarkable with no explanation for patient's confusion.  Given current workup and patient appearing well, feel patient appropriate for discharge with PCP followup for further evaluation of symptoms. Concern for possible dementia.  Sx not consistent with delerium.  This was discussed with the patient and her spouse were in agreement.     Cherre RobinsBryan Mickel Schreur, MD 01/18/14 2328

## 2014-01-18 NOTE — ED Notes (Signed)
Patient transported to CT 

## 2014-01-18 NOTE — ED Notes (Signed)
I gave the patients visitor a cup of ice water.

## 2014-01-18 NOTE — Discharge Instructions (Signed)
Followup with your doctor as soon as possible for further evaluation of confusion.  Return to the emergency department if you have fevers, chest pain, shortness of breath, or concern for worsening symptoms.

## 2014-01-18 NOTE — ED Notes (Signed)
Pt returned from xray

## 2014-01-19 ENCOUNTER — Telehealth: Payer: Self-pay | Admitting: *Deleted

## 2014-01-19 MED ORDER — QUETIAPINE FUMARATE 25 MG PO TABS: ORAL_TABLET | ORAL | Status: DC

## 2014-01-19 NOTE — Telephone Encounter (Signed)
Spouse called. Patient went to ER and tests negative, but still having hallucinations.  Rx called to [facility]([phone]) for Seroquel 25 mg.

## 2014-01-20 ENCOUNTER — Telehealth: Payer: Self-pay | Admitting: *Deleted

## 2014-01-20 ENCOUNTER — Telehealth: Payer: Self-pay

## 2014-01-20 ENCOUNTER — Other Ambulatory Visit: Payer: Self-pay | Admitting: Internal Medicine

## 2014-01-20 DIAGNOSIS — F29 Unspecified psychosis not due to a substance or known physiological condition: Secondary | ICD-10-CM

## 2014-01-20 NOTE — Telephone Encounter (Signed)
Spouse called and states he gave patient Seroquel 25 mg to patient after breakfast .  Patient was responsive and sitting up but almost immediately went to sleep.  Per Dr Oneta RackMcKeown, try giving the patient 1/2 tab and spouse will leave of  Pm dose.  Advised spouse to call back in AM and report how patient tolerated 1/2 tab.

## 2014-01-20 NOTE — Telephone Encounter (Signed)
Pt's spouse called requesting pt be referred to neuro. [facility] & [facility] will contact pt to sched. Pt's spouse aware.

## 2014-02-01 ENCOUNTER — Encounter: Payer: Self-pay | Admitting: Neurology

## 2014-02-01 ENCOUNTER — Ambulatory Visit (INDEPENDENT_AMBULATORY_CARE_PROVIDER_SITE_OTHER): Payer: Medicare Other | Admitting: Neurology

## 2014-02-01 VITALS — BP 150/83 | HR 76 | Ht 65.0 in | Wt 190.0 lb

## 2014-02-01 DIAGNOSIS — R413 Other amnesia: Secondary | ICD-10-CM | POA: Insufficient documentation

## 2014-02-01 DIAGNOSIS — D518 Other vitamin B12 deficiency anemias: Secondary | ICD-10-CM

## 2014-02-01 DIAGNOSIS — H532 Diplopia: Secondary | ICD-10-CM

## 2014-02-01 DIAGNOSIS — I2510 Atherosclerotic heart disease of native coronary artery without angina pectoris: Secondary | ICD-10-CM

## 2014-02-01 DIAGNOSIS — R269 Unspecified abnormalities of gait and mobility: Secondary | ICD-10-CM

## 2014-02-01 HISTORY — DX: Unspecified abnormalities of gait and mobility: R26.9

## 2014-02-01 HISTORY — DX: Diplopia: H53.2

## 2014-02-01 HISTORY — DX: Other amnesia: R41.3

## 2014-02-01 NOTE — Patient Instructions (Signed)

## 2014-02-01 NOTE — Progress Notes (Signed)
Reason for visit: Memory disorder  [name] [name] is a 80 y.o. female  History of present illness:  [name] [name] is an 80 year old right-handed white female with a history of a memory disturbance. One year ago, the patient fractured her right leg, and she required pain medication. The patient became confused with these medications. According to the husband, her mental status returned to baseline. More recently, she developed a cough, and was placed on hydrocodone for this, but this resulted in confusion. With the confusion, she was given Seroquel 25 mg tablets, but 1 tablet resulted in excessive drowsiness, and the patient slept throughout the day. The patient remains somewhat confused. She has a significant gait disorder that is mainly associated with severe hip and knee arthritis. The patient is able to walk short distances within the house with a walker. Within the last 3 or 4 months, she has also developed some double vision. She reports no new numbness or weakness of the extremities, no slurred speech or problems swallowing. She does have some stress incontinence of the bladder. A recent CT scan of the brain was done, and was unremarkable. She is sent to this office for further evaluation.  Past Medical History  Diagnosis Date  . B12 deficiency   . HTN (hypertension)   . DDD (degenerative disc disease)   . Supraventricular tachycardia   . Iron deficiency anemia   . HLD (hyperlipidemia)   . Osteoporosis   . Chronic anticoagulation   . CAD (coronary artery disease)     CFX PTCA '96,LAD stent in '05, cath '07  . Choledocholithiasis   . History of pancreatitis   . Diverticulosis   . Hiatal hernia   . Hx of adenomatous colonic polyps   . Pulmonary emboli 2007  . Abnormality of gait 02/01/2014  . Memory disorder 02/01/2014  . Diplopia 02/01/2014  . HOH (hard of hearing)     Past Surgical History  Procedure Laterality Date  . Kyphosis surgery    . Vertebroplasty  2007  . Laminectomy      . Cholecystectomy  2007  . Cardiac catheterization  '96, 8/04,11/05, 1/07  . Ercp with sphincterotomy  2007  . Thyroidectomy, partial  1950    goiter  . Dilation and curettage of uterus  1955  . Cystoscopy  1977  . Abdominal hysterectomy    . Knee arthroscopy Right 2001    Dr. Ollen BowlHarkins    Family History  Problem Relation Age of Onset  . Stroke Father   . Stroke Brother   . Cancer Brother     prostate  . Diabetes Son   . Colon cancer Neg Hx   . Dementia Sister     Social history:  reports that she has never smoked. She has never used smokeless tobacco. She reports that she does not drink alcohol or use illicit drugs.  Medications:  Current Outpatient Prescriptions on File Prior to Visit  Medication Sig Dispense Refill  . acetaminophen (TYLENOL) 500 MG tablet Take 1,000 mg by mouth every 6 (six) hours as needed for moderate pain.      Marland Kitchen. aspirin 81 MG tablet Take 162 mg by mouth daily.      . bumetanide (BUMEX) 1 MG tablet Take 1 mg by mouth daily.      . Cholecalciferol (VITAMIN D-3 PO) Take 4,000 Units by mouth daily.       . clorazepate (TRANXENE) 7.5 MG tablet take 1/2 to 1 tablet by mouth three times a  day if needed for anxiety or sleep  90 tablet  0  . diltiazem (CARDIZEM CD) 120 MG 24 hr capsule Take 1 capsule (120 mg total) by mouth daily.  30 capsule  6  . famotidine (PEPCID) 40 MG tablet Take 40 mg by mouth daily.      Marland Kitchen MAGNESIUM PO Take 1 tablet by mouth daily.      . meloxicam (MOBIC) 7.5 MG tablet Take 7.5 mg by mouth 2 (two) times daily.      Marland Kitchen OVER THE COUNTER MEDICATION Take 1 tablet by mouth at bedtime as needed (stool softener as needed for constipation).      . pravastatin (PRAVACHOL) 40 MG tablet Take 40 mg by mouth daily.       No current facility-administered medications on file prior to visit.     No Known Allergies  ROS:  Out of a complete 14 system review of symptoms, the patient complains only of the following symptoms, and all other reviewed  systems are negative.  Swelling in the legs Hearing loss Blurred vision, double vision, loss of vision Shortness of breath, wheezing Incontinence, constipation Incontinence of the bladder Easy bruising, easy bleeding Joint pain, achy muscles Memory loss, confusion Hallucinations  Blood pressure 150/83, pulse 76, height 5\' 5"  (1.651 m), weight 190 lb (86.183 kg).  Physical Exam  General: The patient is alert and cooperative at the time of the examination. The patient is moderately obese.  Eyes: Pupils are equal, round, and reactive to light. Discs are flat bilaterally.  Neck: The neck is supple, no carotid bruits are noted.  Respiratory: The respiratory examination is clear.  Cardiovascular: The cardiovascular examination reveals a regular rate and rhythm, no obvious murmurs or rubs are noted.  Skin: Extremities are with 1+ edema at the ankles bilaterally.  Neurologic Exam  Mental status: The Mini-Mental status examination done today shows a total score of 19/30.  Cranial nerves: Facial symmetry is present. There is good sensation of the face to pinprick and soft touch bilaterally. The strength of the facial muscles and the muscles to head turning and shoulder shrug are normal bilaterally. Speech is well enunciated, no aphasia or dysarthria is noted. Extraocular movements are full. Visual fields are full. The tongue is midline, and the patient has symmetric elevation of the soft palate. No obvious hearing deficits are noted. The patient is hard of hearing.  Motor: The motor testing reveals 5 over 5 strength of all 4 extremities. Good symmetric motor tone is noted throughout.  Sensory: Sensory testing is intact to pinprick, soft touch, vibration sensation, and position sense on all 4 extremities. No evidence of extinction is noted.  Coordination: Cerebellar testing reveals good finger-nose-finger and heel-to-shin bilaterally.  Gait and station: The patient has difficulty  arising from a seated position. Once up, she can walk with assistance, gait is slightly wide-based, unsteady. Tandem gait was not attempted. Romberg is negative. No drift is seen.  Reflexes: Deep tendon reflexes are symmetric, but are depressed bilaterally. Toes are downgoing bilaterally.   CT brain 01/18/2014:  IMPRESSION:  1. No acute intracranial pathology.  2. Sinus disease as described above.     Assessment/Plan:  1. Memory disturbance  2. Gait disturbance  3. Degenerative arthritis  4. Reported double vision  The patient is scoring in a moderate range of dementia at this time. She has had some increased confusion recently associated with medication such as hydrocodone and Seroquel. A recent CT scan of the brain was relatively unremarkable.  Further blood work will be done to evaluate the double vision and a memory issue. If the blood work is unremarkable, we will consider medications for memory such as Aricept. She will followup in 6 months. The gait disturbance appears to be primarily associated with severe arthritis of the knees.  Marlan Palau. Keith Willis MD 02/01/2014 5:49 PM  [facility] [address] [place], [place] [id number]  Phone [phone] Fax [phone]

## 2014-02-02 ENCOUNTER — Telehealth: Payer: Self-pay | Admitting: Neurology

## 2014-02-02 LAB — VITAMIN B12: Vitamin B-12: 236 pg/mL (ref 211–946)

## 2014-02-02 LAB — ACETYLCHOLINE RECEPTOR, BINDING

## 2014-02-02 LAB — ANGIOTENSIN CONVERTING ENZYME: Angio Convert Enzyme: 21 U/L (ref 14–82)

## 2014-02-02 LAB — RPR: RPR: NONREACTIVE

## 2014-02-02 MED ORDER — DONEPEZIL HCL 5 MG PO TABS: 5.0000 mg | ORAL_TABLET | Freq: Every day | ORAL | Status: DC

## 2014-02-02 NOTE — Telephone Encounter (Signed)
I called patient. The blood work was unremarkable, she is amenable to starting medication for memory, I will call in a prescription for Aricept. The patient will be seeing an ophthalmologist, Dr. Elmer PickerHecker in the near future as she believes that her vision is quite blurred.

## 2014-02-08 ENCOUNTER — Other Ambulatory Visit: Payer: Self-pay | Admitting: Internal Medicine

## 2014-03-09 ENCOUNTER — Other Ambulatory Visit: Payer: Self-pay | Admitting: Internal Medicine

## 2014-03-11 ENCOUNTER — Other Ambulatory Visit: Payer: Self-pay | Admitting: Cardiovascular Disease

## 2014-03-15 ENCOUNTER — Other Ambulatory Visit: Payer: Self-pay | Admitting: *Deleted

## 2014-03-15 MED ORDER — PRAVASTATIN SODIUM 40 MG PO TABS: 40.0000 mg | ORAL_TABLET | Freq: Every day | ORAL | Status: AC

## 2014-04-04 ENCOUNTER — Telehealth: Payer: Self-pay | Admitting: Neurology

## 2014-04-04 NOTE — Telephone Encounter (Signed)
Spouse has questions regarding donepezil (ARICEPT) 5 MG tablet, has given patient only one dose due to patient became very belligerent.  PCP prescribed Seroquel 25 mg and medication makes her sleepy.   Spouse concerned with side effects of the Aricept.  Please call and advise

## 2014-04-04 NOTE — Telephone Encounter (Signed)
Spouse has questions regarding donepezil (ARICEPT) 5 MG tablet, has given patient only one dose due to patient became very belligerent.  PCP prescribed Seroquel 25 mg and medication makes her sleepy.   Spouse concerned with side effects of the Aricept.  Please call and advise °

## 2014-04-04 NOTE — Telephone Encounter (Signed)
I called the patient. The patient became aggitated on the aricept. They are not to continue the medication. Even on 1/2 of a 25 mg seroquel, she is drowsy. They will continue this, as it does help her sleep at night. Otherwise she is up every 2 hours to urinate at night.

## 2014-04-06 ENCOUNTER — Telehealth: Payer: Self-pay | Admitting: Neurology

## 2014-04-06 NOTE — Telephone Encounter (Signed)
Patient's husband calling-states patient is talking out of her head and is very upset--please call.

## 2014-04-06 NOTE — Telephone Encounter (Signed)
Spoke with husband and patient started hallucinating last night, seems upset and saying that she is not home when she is, fighting this morning, and will not take all of her medicine(Seroquel), does not trust him. Does not know what to do and patient is still very upset.

## 2014-04-06 NOTE — Telephone Encounter (Signed)
I called patient. I talked with the husband. The patient got somewhat upset this morning, he was able to get one half of a 25 mg Seroquel tablets in to her, and this has helped. He did not give the medication to her last night. I have recommended using the one half tablet of Seroquel at night every night, and using one half tablet during the day only if needed.

## 2014-04-10 ENCOUNTER — Other Ambulatory Visit: Payer: Self-pay | Admitting: Internal Medicine

## 2014-04-11 ENCOUNTER — Telehealth: Payer: Self-pay | Admitting: Neurology

## 2014-04-11 NOTE — Telephone Encounter (Signed)
Spouse has additional questions regarding patients hallucinations.  Please return call asap.  Thanks

## 2014-04-12 ENCOUNTER — Telehealth: Payer: Self-pay | Admitting: *Deleted

## 2014-04-12 MED ORDER — ARIPIPRAZOLE 2 MG PO TABS: 1.0000 mg | ORAL_TABLET | Freq: Two times a day (BID) | ORAL | Status: DC | PRN

## 2014-04-12 NOTE — Telephone Encounter (Signed)
Spouse called and states patient's feet turn in when she stands.  Per the physical therapist that is visiting patient, that could be causing patient's knees to hurt. PT suggested arch supports to help.  Per Dr Oneta RackMcKeown, refer to an orthopedist.

## 2014-04-12 NOTE — Telephone Encounter (Signed)
Patient's spouse has additional questions about the hallucinations his wife is having. Please call.   The patient is scoring in a moderate range of dementia at this time. She has had some increased confusion recently associated with medication such as hydrocodone and Seroquel. A recent CT scan of the brain was relatively unremarkable. Further blood work will be done to evaluate the double vision and a memory issue. If the blood work is unremarkable, we will consider medications for memory such as Aricept. She will followup in 6 months. The gait disturbance appears to be primarily associated with severe arthritis of the knees.

## 2014-04-12 NOTE — Telephone Encounter (Signed)
I called patient. The patient is having some ongoing problems with confusion, but the Seroquel is quite sedating if he uses it during the day. I will try low-dose Abilify to see this works.

## 2014-04-13 ENCOUNTER — Telehealth: Payer: Self-pay | Admitting: Neurology

## 2014-04-13 NOTE — Telephone Encounter (Signed)
Spouse called and stated Rx forARIPiprazole (ABILIFY) 2 MG tablet was too expensive.  Is there another medication, less expensive that they could try.  Please call and advise.

## 2014-04-14 MED ORDER — RISPERIDONE 0.5 MG PO TABS: 0.5000 mg | ORAL_TABLET | Freq: Two times a day (BID) | ORAL | Status: DC | PRN

## 2014-04-14 NOTE — Telephone Encounter (Signed)
Please advise 

## 2014-04-14 NOTE — Telephone Encounter (Signed)
I called the husband. He is unable to afford the Abilify, as this would cause almost $100. I will try Risperdal instead at 0.5 mg tablets, taking 1 tablet twice daily if needed.

## 2014-04-18 ENCOUNTER — Telehealth: Payer: Self-pay | Admitting: Neurology

## 2014-04-18 NOTE — Telephone Encounter (Signed)
Patient's spouse called and stated risperiDONE (RISPERDAL) 0.5 MG tablet did nothing to help with patient's hallucinating.  He's at his wit's end and request a return call asap @ [phone].  Thanks

## 2014-04-18 NOTE — Telephone Encounter (Signed)
I called the patient, talked with the husband. The patient has given her Risperdal 0.5 mg over the last 3 days without much benefit. We will try doubling the dose, if this does not work, we will need to use another medication.

## 2014-04-18 NOTE — Telephone Encounter (Signed)
Spoke with patient and he said that the new medication that patient started on Saturday has made the hallucinating worse, never stops and he thinks he is going beserks.  Is there anything else she can take?

## 2014-04-19 ENCOUNTER — Encounter (HOSPITAL_COMMUNITY): Payer: Self-pay | Admitting: Emergency Medicine

## 2014-04-19 ENCOUNTER — Emergency Department (HOSPITAL_COMMUNITY): Payer: Medicare Other

## 2014-04-19 ENCOUNTER — Inpatient Hospital Stay (HOSPITAL_COMMUNITY)
Admission: EM | Admit: 2014-04-19 | Discharge: 2014-04-23 | DRG: 884 | Disposition: A | Discharge status: Skilled Nursing Facility | Payer: Medicare Other | Attending: Family Medicine | Admitting: Family Medicine

## 2014-04-19 DIAGNOSIS — E782 Mixed hyperlipidemia: Secondary | ICD-10-CM

## 2014-04-19 DIAGNOSIS — H532 Diplopia: Secondary | ICD-10-CM

## 2014-04-19 DIAGNOSIS — Z9089 Acquired absence of other organs: Secondary | ICD-10-CM

## 2014-04-19 DIAGNOSIS — I2510 Atherosclerotic heart disease of native coronary artery without angina pectoris: Secondary | ICD-10-CM | POA: Diagnosis present

## 2014-04-19 DIAGNOSIS — Z86711 Personal history of pulmonary embolism: Secondary | ICD-10-CM

## 2014-04-19 DIAGNOSIS — M810 Age-related osteoporosis without current pathological fracture: Secondary | ICD-10-CM

## 2014-04-19 DIAGNOSIS — E785 Hyperlipidemia, unspecified: Secondary | ICD-10-CM | POA: Diagnosis present

## 2014-04-19 DIAGNOSIS — IMO0002 Reserved for concepts with insufficient information to code with codable children: Secondary | ICD-10-CM

## 2014-04-19 DIAGNOSIS — R4182 Altered mental status, unspecified: Secondary | ICD-10-CM

## 2014-04-19 DIAGNOSIS — F0390 Unspecified dementia without behavioral disturbance: Secondary | ICD-10-CM | POA: Diagnosis not present

## 2014-04-19 DIAGNOSIS — R413 Other amnesia: Secondary | ICD-10-CM

## 2014-04-19 DIAGNOSIS — E1129 Type 2 diabetes mellitus with other diabetic kidney complication: Secondary | ICD-10-CM

## 2014-04-19 DIAGNOSIS — R296 Repeated falls: Secondary | ICD-10-CM

## 2014-04-19 DIAGNOSIS — Z820 Family history of epilepsy and other diseases of the nervous system: Secondary | ICD-10-CM

## 2014-04-19 DIAGNOSIS — R410 Disorientation, unspecified: Secondary | ICD-10-CM

## 2014-04-19 DIAGNOSIS — Z9861 Coronary angioplasty status: Secondary | ICD-10-CM

## 2014-04-19 DIAGNOSIS — R5381 Other malaise: Secondary | ICD-10-CM | POA: Diagnosis present

## 2014-04-19 DIAGNOSIS — Z860100 Personal history of colon polyps, unspecified: Secondary | ICD-10-CM

## 2014-04-19 DIAGNOSIS — Z833 Family history of diabetes mellitus: Secondary | ICD-10-CM

## 2014-04-19 DIAGNOSIS — H9190 Unspecified hearing loss, unspecified ear: Secondary | ICD-10-CM | POA: Diagnosis present

## 2014-04-19 DIAGNOSIS — Z79899 Other long term (current) drug therapy: Secondary | ICD-10-CM

## 2014-04-19 DIAGNOSIS — Z8042 Family history of malignant neoplasm of prostate: Secondary | ICD-10-CM

## 2014-04-19 DIAGNOSIS — I10 Essential (primary) hypertension: Secondary | ICD-10-CM

## 2014-04-19 DIAGNOSIS — E538 Deficiency of other specified B group vitamins: Secondary | ICD-10-CM

## 2014-04-19 DIAGNOSIS — Z7982 Long term (current) use of aspirin: Secondary | ICD-10-CM

## 2014-04-19 DIAGNOSIS — D509 Iron deficiency anemia, unspecified: Secondary | ICD-10-CM

## 2014-04-19 DIAGNOSIS — Z8679 Personal history of other diseases of the circulatory system: Secondary | ICD-10-CM

## 2014-04-19 DIAGNOSIS — R269 Unspecified abnormalities of gait and mobility: Secondary | ICD-10-CM

## 2014-04-19 DIAGNOSIS — K5909 Other constipation: Secondary | ICD-10-CM

## 2014-04-19 DIAGNOSIS — Z86718 Personal history of other venous thrombosis and embolism: Secondary | ICD-10-CM

## 2014-04-19 DIAGNOSIS — N390 Urinary tract infection, site not specified: Secondary | ICD-10-CM

## 2014-04-19 DIAGNOSIS — E559 Vitamin D deficiency, unspecified: Secondary | ICD-10-CM

## 2014-04-19 DIAGNOSIS — R0902 Hypoxemia: Secondary | ICD-10-CM

## 2014-04-19 DIAGNOSIS — Z823 Family history of stroke: Secondary | ICD-10-CM

## 2014-04-19 DIAGNOSIS — R443 Hallucinations, unspecified: Secondary | ICD-10-CM

## 2014-04-19 DIAGNOSIS — Z7901 Long term (current) use of anticoagulants: Secondary | ICD-10-CM

## 2014-04-19 DIAGNOSIS — R55 Syncope and collapse: Secondary | ICD-10-CM

## 2014-04-19 LAB — CBC WITH DIFFERENTIAL/PLATELET
BASOS ABS: 0 10*3/uL (ref 0.0–0.1)
Basophils Relative: 0 % (ref 0–1)
EOS ABS: 0.1 10*3/uL (ref 0.0–0.7)
EOS PCT: 1 % (ref 0–5)
HCT: 44.2 % (ref 36.0–46.0)
Hemoglobin: 14.2 g/dL (ref 12.0–15.0)
Lymphocytes Relative: 13 % (ref 12–46)
Lymphs Abs: 1.2 10*3/uL (ref 0.7–4.0)
MCH: 27.2 pg (ref 26.0–34.0)
MCHC: 32.1 g/dL (ref 30.0–36.0)
MCV: 84.5 fL (ref 78.0–100.0)
Monocytes Absolute: 1.1 10*3/uL — ABNORMAL HIGH (ref 0.1–1.0)
Monocytes Relative: 11 % (ref 3–12)
NEUTROS ABS: 7.1 10*3/uL (ref 1.7–7.7)
Neutrophils Relative %: 75 % (ref 43–77)
Platelets: 230 10*3/uL (ref 150–400)
RBC: 5.23 MIL/uL — ABNORMAL HIGH (ref 3.87–5.11)
RDW: 15.1 % (ref 11.5–15.5)
WBC: 9.5 10*3/uL (ref 4.0–10.5)

## 2014-04-19 LAB — URINALYSIS, ROUTINE W REFLEX MICROSCOPIC
Bilirubin Urine: NEGATIVE
GLUCOSE, UA: NEGATIVE mg/dL
Ketones, ur: NEGATIVE mg/dL
NITRITE: POSITIVE — AB
PH: 6.5 (ref 5.0–8.0)
Protein, ur: NEGATIVE mg/dL
Specific Gravity, Urine: 1.021 (ref 1.005–1.030)
Urobilinogen, UA: 0.2 mg/dL (ref 0.0–1.0)

## 2014-04-19 LAB — COMPREHENSIVE METABOLIC PANEL
ALBUMIN: 3.6 g/dL (ref 3.5–5.2)
ALT: 11 U/L (ref 0–35)
AST: 12 U/L (ref 0–37)
Alkaline Phosphatase: 59 U/L (ref 39–117)
Anion gap: 14 (ref 5–15)
BUN: 29 mg/dL — ABNORMAL HIGH (ref 6–23)
CALCIUM: 9.7 mg/dL (ref 8.4–10.5)
CO2: 28 meq/L (ref 19–32)
Chloride: 100 mEq/L (ref 96–112)
Creatinine, Ser: 1.03 mg/dL (ref 0.50–1.10)
GFR calc Af Amer: 55 mL/min — ABNORMAL LOW (ref >90)
GFR calc non Af Amer: 47 mL/min — ABNORMAL LOW (ref >90)
Glucose, Bld: 120 mg/dL — ABNORMAL HIGH (ref 70–99)
Potassium: 4.2 mEq/L (ref 3.7–5.3)
SODIUM: 142 meq/L (ref 137–147)
Total Bilirubin: 0.4 mg/dL (ref 0.3–1.2)
Total Protein: 6.6 g/dL (ref 6.0–8.3)

## 2014-04-19 LAB — CBG MONITORING, ED: GLUCOSE-CAPILLARY: 100 mg/dL — AB (ref 70–99)

## 2014-04-19 LAB — URINE MICROSCOPIC-ADD ON

## 2014-04-19 LAB — I-STAT CG4 LACTIC ACID, ED: LACTIC ACID, VENOUS: 1.17 mmol/L (ref 0.5–2.2)

## 2014-04-19 LAB — PRO B NATRIURETIC PEPTIDE: Pro B Natriuretic peptide (BNP): 87.2 pg/mL (ref 0–450)

## 2014-04-19 LAB — AMMONIA: Ammonia: 19 umol/L (ref 11–60)

## 2014-04-19 MED ORDER — IOHEXOL 350 MG/ML SOLN
100.0000 mL | Freq: Once | INTRAVENOUS | Status: AC | PRN
  Administered 2014-04-19: 100 mL via INTRAVENOUS

## 2014-04-19 MED ORDER — DEXTROSE 5 % IV SOLN
1.0000 g | Freq: Once | INTRAVENOUS | Status: AC
  Administered 2014-04-19: 1 g via INTRAVENOUS
  Filled 2014-04-19: qty 10

## 2014-04-19 NOTE — ED Provider Notes (Signed)
CSN: [id number]     Arrival date & time 04/19/14  1821 History   First MD Initiated Contact with Patient 04/19/14 1826     Chief Complaint  Patient presents with  . Hallucinations     (Consider location/radiation/quality/duration/timing/severity/associated sxs/prior Treatment) HPI 80 year old female presents with worsening hallucinations. The husband gives most of the history is appreciated but does all the symptoms. He states she's been seeing multiple things, including ants, other animals, and other people. This has been going on for a couple months but seems to be worsening. The patient has not had any fevers, headaches, cough, or shortness of breath. She's not normally on oxygen was noted to have an O2 sat of 86% on arrival. Husband notes that he was told by a nurse he came to the house but her sats were low last week. No pain. The patient seems to deny these symptoms.  Past Medical History  Diagnosis Date  . B12 deficiency   . HTN (hypertension)   . DDD (degenerative disc disease)   . Supraventricular tachycardia   . Iron deficiency anemia   . HLD (hyperlipidemia)   . Osteoporosis   . Chronic anticoagulation   . CAD (coronary artery disease)     CFX PTCA '96,LAD stent in '05, cath '07  . Choledocholithiasis   . History of pancreatitis   . Diverticulosis   . Hiatal hernia   . Hx of adenomatous colonic polyps   . Pulmonary emboli 2007  . Abnormality of gait 02/01/2014  . Memory disorder 02/01/2014  . Diplopia 02/01/2014  . HOH (hard of hearing)    Past Surgical History  Procedure Laterality Date  . Kyphosis surgery    . Vertebroplasty  2007  . Laminectomy    . Cholecystectomy  2007  . Cardiac catheterization  '96, 8/04,11/05, 1/07  . Ercp with sphincterotomy  2007  . Thyroidectomy, partial  1950    goiter  . Dilation and curettage of uterus  1955  . Cystoscopy  1977  . Abdominal hysterectomy    . Knee arthroscopy Right 2001    Dr. Ollen BowlHarkins   Family History  Problem  Relation Age of Onset  . Stroke Father   . Stroke Brother   . Cancer Brother     prostate  . Diabetes Son   . Colon cancer Neg Hx   . Dementia Sister    History  Substance Use Topics  . Smoking status: Never Smoker   . Smokeless tobacco: Never Used  . Alcohol Use: No   OB History   Grav Para Term Preterm Abortions TAB SAB Ect Mult Living                 Review of Systems  Constitutional: Negative for fever.  Respiratory: Negative for cough and shortness of breath.   Cardiovascular: Negative for chest pain.  Gastrointestinal: Negative for vomiting and diarrhea.  Neurological: Negative for headaches.  Psychiatric/Behavioral: Positive for hallucinations and confusion.  All other systems reviewed and are negative.     Allergies  Review of patient's allergies indicates no known allergies.  Home Medications   Prior to Admission medications   Medication Sig Start Date End Date Taking? Authorizing Provider  acetaminophen (TYLENOL) 500 MG tablet Take 1,000 mg by mouth every 6 (six) hours as needed for moderate pain.    Historical Provider, MD  aspirin 81 MG tablet Take 162 mg by mouth daily.    Historical Provider, MD  bumetanide (BUMEX) 1 MG tablet Take 1  mg by mouth daily.    Historical Provider, MD  Cholecalciferol (VITAMIN D-3 PO) Take 4,000 Units by mouth daily.     Historical Provider, MD  clorazepate (TRANXENE) 7.5 MG tablet take 1/2 to 1 tablet by mouth three times a day if needed for anxiety    Melissa R Smith, PA-C  dicyclomine (BENTYL) 20 MG tablet Take 20 mg by mouth daily as needed. 12/02/13   Historical Provider, MD  diltiazem (CARDIZEM CD) 120 MG 24 hr capsule Take 1 capsule (120 mg total) by mouth daily. 03/11/14   Mihai Croitoru, MD  donepezil (ARICEPT) 5 MG tablet Take 1 tablet (5 mg total) by mouth at bedtime. 02/02/14   York Spaniel, MD  famotidine (PEPCID) 40 MG tablet Take 40 mg by mouth daily.    Historical Provider, MD  MAGNESIUM PO Take 1 tablet by mouth  daily.    Historical Provider, MD  meloxicam (MOBIC) 7.5 MG tablet Take 7.5 mg by mouth 2 (two) times daily.    Historical Provider, MD  OVER THE COUNTER MEDICATION Take 1 tablet by mouth at bedtime as needed (stool softener as needed for constipation).    Historical Provider, MD  pravastatin (PRAVACHOL) 40 MG tablet Take 1 tablet (40 mg total) by mouth daily. 03/15/14   Lucky Cowboy, MD  risperiDONE (RISPERDAL) 0.5 MG tablet Take 1 tablet (0.5 mg total) by mouth 2 (two) times daily as needed. 04/14/14   York Spaniel, MD   BP 153/73  Pulse 91  Temp(Src) 97.8 F (36.6 C) (Oral)  Resp 18  SpO2 90% Physical Exam  Nursing note and vitals reviewed. Constitutional: She is oriented to person, place, and time. She appears well-developed and well-nourished. No distress.  HENT:  Head: Normocephalic and atraumatic.  Right Ear: External ear normal.  Left Ear: External ear normal.  Nose: Nose normal.  Eyes: EOM are normal. Pupils are equal, round, and reactive to light. Right eye exhibits no discharge. Left eye exhibits no discharge.  Cardiovascular: Normal rate, regular rhythm and normal heart sounds.   Pulmonary/Chest: Effort normal and breath sounds normal.  Abdominal: Soft. She exhibits no distension. There is no tenderness.  Neurological: She is alert and oriented to person, place, and time.  CN 2-12 grossly intact. Normal strength in all 4 extremities.  Skin: Skin is warm and dry.  Psychiatric: She is not actively hallucinating.    ED Course  Procedures (including critical care time) Labs Review Labs Reviewed  CBC WITH DIFFERENTIAL - Abnormal; Notable for the following:    RBC 5.23 (*)    Monocytes Absolute 1.1 (*)    All other components within normal limits  COMPREHENSIVE METABOLIC PANEL - Abnormal; Notable for the following:    Glucose, Bld 120 (*)    BUN 29 (*)    GFR calc non Af Amer 47 (*)    GFR calc Af Amer 55 (*)    All other components within normal limits   URINALYSIS, ROUTINE W REFLEX MICROSCOPIC - Abnormal; Notable for the following:    APPearance CLOUDY (*)    Hgb urine dipstick MODERATE (*)    Nitrite POSITIVE (*)    Leukocytes, UA SMALL (*)    All other components within normal limits  BLOOD GAS, ARTERIAL - Abnormal; Notable for the following:    pCO2 arterial 48.0 (*)    pO2, Arterial 119.0 (*)    Bicarbonate 30.2 (*)    Acid-Base Excess 4.8 (*)    All other components within  normal limits  URINE MICROSCOPIC-ADD ON - Abnormal; Notable for the following:    Bacteria, UA MANY (*)    Casts HYALINE CASTS (*)    All other components within normal limits  CBG MONITORING, ED - Abnormal; Notable for the following:    Glucose-Capillary 100 (*)    All other components within normal limits  URINE CULTURE  AMMONIA  PRO B NATRIURETIC PEPTIDE  I-STAT CG4 LACTIC ACID, ED    Imaging Review Dg Chest 2 View  04/19/2014   CLINICAL DATA:  Hallucinations. Hypoxia. History of pulmonary emboli. Hypertension.  EXAM: CHEST  2 VIEW  COMPARISON:  01/18/2014  FINDINGS: Prominently elevated right hemidiaphragm, as before. Tortuous thoracic aorta. Right heart is obscured by the prominent elevation of the right hemidiaphragm.  Chronic atelectasis along the elevated hemidiaphragm.  Subsegmental atelectasis or scarring along the left hemidiaphragm. No new or acute findings.  IMPRESSION: 1. Stable chest with prominently elevated right hemidiaphragm with adjacent atelectasis, and subsegmental atelectasis or scarring along the left hemidiaphragm.   Electronically Signed   By: Herbie Baltimore M.D.   On: 04/19/2014 20:24   Ct Head Wo Contrast  04/19/2014   CLINICAL DATA:  Altered mental status.  Hallucinations  EXAM: CT HEAD WITHOUT CONTRAST  TECHNIQUE: Contiguous axial images were obtained from the base of the skull through the vertex without intravenous contrast.  COMPARISON:  CT head 01/18/2014  FINDINGS: Mild atrophy. Mild chronic microvascular ischemic change in  the white matter, stable.  Negative for acute infarct.  Negative for hemorrhage or mass.  Mucosal thickening in the sphenoid sinus bilaterally with bony thickening compatible with chronic sinusitis.  IMPRESSION: Generalized atrophy and mild chronic microvascular ischemia. No acute abnormality  Chronic sinusitis.   Electronically Signed   By: Marlan Palau M.D.   On: 04/19/2014 20:40   Ct Angio Chest W/cm &/or Wo Cm  04/19/2014   CLINICAL DATA:  Hypoxia.  History of pulmonary embolism.  EXAM: CT ANGIOGRAPHY CHEST WITH CONTRAST  TECHNIQUE: Multidetector CT imaging of the chest was performed using the standard protocol during bolus administration of intravenous contrast. Multiplanar CT image reconstructions and MIPs were obtained to evaluate the vascular anatomy.  CONTRAST:  OMNIPAQUE IOHEXOL 350 MG/ML SOLN  COMPARISON:  Radiographs today. Abdominal CT 05/04/2013 and chest CT 10/22/2005.  FINDINGS: Vascular: The pulmonary arteries are well opacified with contrast. There is no evidence of acute pulmonary embolism. There is atherosclerosis of the aorta, great vessels and coronary arteries.  Mediastinum: There are no enlarged mediastinal, hilar or axillary lymph nodes. 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 The trachea and esophagus demonstrate no significant findings. The heart size is normal.  Lungs/Pleura: There is chronic elevation of the right hemidiaphragm which is progressive compared with the prior CT. There is associated progressive right greater than left basilar atelectasis. No airspace disease or endobronchial lesion is seen. There is no significant pleural effusion. A small pericardial effusion is stable.  Upper abdomen: Chronic but progressive elevation of the right hemidiaphragm. Stable mild extrahepatic biliary dilatation status post cholecystectomy.  Musculoskeletal/Chest wall: No chest wall lesion or acute osseous findings.  Review of the MIP images confirms the above findings.   IMPRESSION: 1. No evidence of acute pulmonary embolism. 2. No acute chest findings demonstrated. 3. Progressive elevation of the right hemidiaphragm compared with prior CT from 2007. There is associated chronic right-greater-than-left basilar atelectasis which appears mildly progressive. 4. Moderate atherosclerosis.   Electronically Signed   By: Sandi Mariscal.D.  On: 04/19/2014 23:29     EKG Interpretation   Date/Time:  Tuesday April 19 2014 19:32:47 EDT Ventricular Rate:  72 PR Interval:  166 QRS Duration: 83 QT Interval:  418 QTC Calculation: 457 R Axis:   26 Text Interpretation:  Sinus rhythm Anteroseptal infarct, age indeterminate  No significant change since last tracing Confirmed by Lurleen Soltero  MD, Cherylanne Ardelean  ([id number]) on 04/19/2014 7:43:14 PM      MDM   Final diagnoses:  UTI (lower urinary tract infection)  Hallucination  Hypoxia    Patient's acute worsening of her hallucinations and confusion is likely due to her UTI. She was given IV Rocephin and urine will be sent for culture. Her hypoxia is unclear etiology, this does not appear hyperacute but is more likely subacute. However this could also be contributing. No obvious etiology with her CT angiogram been negative. Given this, we'll need admission for IV antibiotics and oxygen to the hospitalist    Audree Camel, MD 04/20/14 0021

## 2014-04-19 NOTE — ED Notes (Signed)
Pt does not have to urinate at this time. Will re attempt to collect.

## 2014-04-19 NOTE — ED Notes (Addendum)
Per EMS, pt from home, pt's husband reports pt is having visual hallucinations today, has hx of dementia.  Pt states that her husband had a couple of women in her house with him.    Pt denies any pain or any trouble breathing at this time. Pt's sats RA is 86%.

## 2014-04-19 NOTE — ED Notes (Signed)
Per EMS: Pt's husband called 911 because he had been arguing with her, she started talking to deceased relatives. Pt states there were two women in the house, thought to be a hallucination. Hx of dementia.

## 2014-04-19 NOTE — ED Notes (Signed)
[facility]

## 2014-04-19 NOTE — ED Notes (Signed)
Bed: WA20 Expected date:  Expected time:  Means of arrival:  Comments: EMS/demented pt/[facility]-22

## 2014-04-19 NOTE — ED Notes (Signed)
Pt states last night husband had eyes on his forehead and a little nose that were above the rest of his face. She asked what was wrong with him and he said nothing. Today pt saw husband in bed with two women who all the sudden disappeared into thin air. Pt states she has been taking all her medications as ordered. Denies any pain. Oriented to person and place.

## 2014-04-20 ENCOUNTER — Ambulatory Visit: Payer: Self-pay | Admitting: Emergency Medicine

## 2014-04-20 ENCOUNTER — Ambulatory Visit: Payer: Self-pay | Admitting: Physician Assistant

## 2014-04-20 DIAGNOSIS — Z86010 Personal history of colonic polyps: Secondary | ICD-10-CM | POA: Diagnosis not present

## 2014-04-20 DIAGNOSIS — F0390 Unspecified dementia without behavioral disturbance: Secondary | ICD-10-CM

## 2014-04-20 DIAGNOSIS — Z9861 Coronary angioplasty status: Secondary | ICD-10-CM | POA: Diagnosis not present

## 2014-04-20 DIAGNOSIS — I10 Essential (primary) hypertension: Secondary | ICD-10-CM | POA: Diagnosis present

## 2014-04-20 DIAGNOSIS — R4182 Altered mental status, unspecified: Secondary | ICD-10-CM

## 2014-04-20 DIAGNOSIS — Z7982 Long term (current) use of aspirin: Secondary | ICD-10-CM | POA: Diagnosis not present

## 2014-04-20 DIAGNOSIS — N390 Urinary tract infection, site not specified: Secondary | ICD-10-CM

## 2014-04-20 DIAGNOSIS — R5381 Other malaise: Secondary | ICD-10-CM | POA: Diagnosis present

## 2014-04-20 DIAGNOSIS — Z8042 Family history of malignant neoplasm of prostate: Secondary | ICD-10-CM | POA: Diagnosis not present

## 2014-04-20 DIAGNOSIS — Z823 Family history of stroke: Secondary | ICD-10-CM | POA: Diagnosis not present

## 2014-04-20 DIAGNOSIS — Z820 Family history of epilepsy and other diseases of the nervous system: Secondary | ICD-10-CM | POA: Diagnosis not present

## 2014-04-20 DIAGNOSIS — E538 Deficiency of other specified B group vitamins: Secondary | ICD-10-CM | POA: Diagnosis present

## 2014-04-20 DIAGNOSIS — R6889 Other general symptoms and signs: Secondary | ICD-10-CM

## 2014-04-20 DIAGNOSIS — Z7901 Long term (current) use of anticoagulants: Secondary | ICD-10-CM | POA: Diagnosis not present

## 2014-04-20 DIAGNOSIS — R443 Hallucinations, unspecified: Secondary | ICD-10-CM | POA: Diagnosis present

## 2014-04-20 DIAGNOSIS — M810 Age-related osteoporosis without current pathological fracture: Secondary | ICD-10-CM | POA: Diagnosis present

## 2014-04-20 DIAGNOSIS — Z86711 Personal history of pulmonary embolism: Secondary | ICD-10-CM | POA: Diagnosis not present

## 2014-04-20 DIAGNOSIS — I2510 Atherosclerotic heart disease of native coronary artery without angina pectoris: Secondary | ICD-10-CM | POA: Diagnosis present

## 2014-04-20 DIAGNOSIS — Z833 Family history of diabetes mellitus: Secondary | ICD-10-CM | POA: Diagnosis not present

## 2014-04-20 DIAGNOSIS — H9190 Unspecified hearing loss, unspecified ear: Secondary | ICD-10-CM | POA: Diagnosis present

## 2014-04-20 DIAGNOSIS — E785 Hyperlipidemia, unspecified: Secondary | ICD-10-CM | POA: Diagnosis present

## 2014-04-20 DIAGNOSIS — Z79899 Other long term (current) drug therapy: Secondary | ICD-10-CM | POA: Diagnosis not present

## 2014-04-20 DIAGNOSIS — Z9089 Acquired absence of other organs: Secondary | ICD-10-CM | POA: Diagnosis not present

## 2014-04-20 LAB — BLOOD GAS, ARTERIAL
Acid-Base Excess: 4.8 mmol/L — ABNORMAL HIGH (ref 0.0–2.0)
Bicarbonate: 30.2 mEq/L — ABNORMAL HIGH (ref 20.0–24.0)
DRAWN BY: 232811
O2 CONTENT: 3 L/min
O2 Saturation: 98.2 %
PATIENT TEMPERATURE: 97.5
PCO2 ART: 48 mmHg — AB (ref 35.0–45.0)
TCO2: 26.6 mmol/L (ref 0–100)
pH, Arterial: 7.412 (ref 7.350–7.450)
pO2, Arterial: 119 mmHg — ABNORMAL HIGH (ref 80.0–100.0)

## 2014-04-20 LAB — CBC
HEMATOCRIT: 43.7 % (ref 36.0–46.0)
HEMOGLOBIN: 13.8 g/dL (ref 12.0–15.0)
MCH: 27 pg (ref 26.0–34.0)
MCHC: 31.6 g/dL (ref 30.0–36.0)
MCV: 85.5 fL (ref 78.0–100.0)
Platelets: 217 10*3/uL (ref 150–400)
RBC: 5.11 MIL/uL (ref 3.87–5.11)
RDW: 15 % (ref 11.5–15.5)
WBC: 9.4 10*3/uL (ref 4.0–10.5)

## 2014-04-20 LAB — COMPREHENSIVE METABOLIC PANEL
ALBUMIN: 3.3 g/dL — AB (ref 3.5–5.2)
ALK PHOS: 53 U/L (ref 39–117)
ALT: 9 U/L (ref 0–35)
AST: 11 U/L (ref 0–37)
Anion gap: 11 (ref 5–15)
BUN: 22 mg/dL (ref 6–23)
CHLORIDE: 103 meq/L (ref 96–112)
CO2: 29 mEq/L (ref 19–32)
Calcium: 9.3 mg/dL (ref 8.4–10.5)
Creatinine, Ser: 0.83 mg/dL (ref 0.50–1.10)
GFR calc Af Amer: 71 mL/min — ABNORMAL LOW (ref >90)
GFR calc non Af Amer: 61 mL/min — ABNORMAL LOW (ref >90)
GLUCOSE: 87 mg/dL (ref 70–99)
POTASSIUM: 4.1 meq/L (ref 3.7–5.3)
Sodium: 143 mEq/L (ref 137–147)
TOTAL PROTEIN: 5.9 g/dL — AB (ref 6.0–8.3)
Total Bilirubin: 0.5 mg/dL (ref 0.3–1.2)

## 2014-04-20 LAB — HEMOGLOBIN A1C
Hgb A1c MFr Bld: 6.1 % — ABNORMAL HIGH (ref <5.7)
MEAN PLASMA GLUCOSE: 128 mg/dL — AB (ref <117)

## 2014-04-20 LAB — GLUCOSE, CAPILLARY
GLUCOSE-CAPILLARY: 105 mg/dL — AB (ref 70–99)
Glucose-Capillary: 87 mg/dL (ref 70–99)

## 2014-04-20 LAB — TSH: TSH: 2.8 u[IU]/mL (ref 0.350–4.500)

## 2014-04-20 MED ORDER — HALOPERIDOL LACTATE 5 MG/ML IJ SOLN
0.5000 mg | Freq: Once | INTRAMUSCULAR | Status: AC
  Administered 2014-04-20: 0.5 mg via INTRAVENOUS
  Filled 2014-04-20: qty 1

## 2014-04-20 MED ORDER — PRAVASTATIN SODIUM 40 MG PO TABS
40.0000 mg | ORAL_TABLET | Freq: Every day | ORAL | Status: DC
  Administered 2014-04-21 – 2014-04-22 (×2): 40 mg via ORAL
  Filled 2014-04-20 (×4): qty 1

## 2014-04-20 MED ORDER — SIMVASTATIN 20 MG PO TABS: 20.0000 mg | ORAL_TABLET | Freq: Every day | ORAL | Status: DC

## 2014-04-20 MED ORDER — ASPIRIN EC 81 MG PO TBEC
81.0000 mg | DELAYED_RELEASE_TABLET | Freq: Every day | ORAL | Status: DC
  Administered 2014-04-20 – 2014-04-23 (×4): 81 mg via ORAL
  Filled 2014-04-20 (×4): qty 1

## 2014-04-20 MED ORDER — MELOXICAM 7.5 MG PO TABS
7.5000 mg | ORAL_TABLET | Freq: Two times a day (BID) | ORAL | Status: DC
  Administered 2014-04-20 – 2014-04-23 (×7): 7.5 mg via ORAL
  Filled 2014-04-20 (×9): qty 1

## 2014-04-20 MED ORDER — FAMOTIDINE 40 MG PO TABS
40.0000 mg | ORAL_TABLET | Freq: Every day | ORAL | Status: DC
  Administered 2014-04-20 – 2014-04-23 (×4): 40 mg via ORAL
  Filled 2014-04-20 (×4): qty 1

## 2014-04-20 MED ORDER — LORAZEPAM 2 MG/ML IJ SOLN: 0.5000 mg | Freq: Four times a day (QID) | INTRAMUSCULAR | Status: DC | PRN

## 2014-04-20 MED ORDER — ADULT MULTIVITAMIN W/MINERALS CH
1.0000 | ORAL_TABLET | Freq: Every day | ORAL | Status: DC
  Administered 2014-04-20 – 2014-04-23 (×4): 1 via ORAL
  Filled 2014-04-20 (×4): qty 1

## 2014-04-20 MED ORDER — ONDANSETRON HCL 4 MG PO TABS: 4.0000 mg | ORAL_TABLET | Freq: Four times a day (QID) | ORAL | Status: DC | PRN

## 2014-04-20 MED ORDER — ONDANSETRON HCL 4 MG/2ML IJ SOLN: 4.0000 mg | Freq: Four times a day (QID) | INTRAMUSCULAR | Status: DC | PRN

## 2014-04-20 MED ORDER — LORAZEPAM 2 MG/ML IJ SOLN
0.5000 mg | Freq: Once | INTRAMUSCULAR | Status: DC
  Filled 2014-04-20: qty 1

## 2014-04-20 MED ORDER — HEPARIN SODIUM (PORCINE) 5000 UNIT/ML IJ SOLN
5000.0000 [IU] | Freq: Three times a day (TID) | INTRAMUSCULAR | Status: DC
Start: 2014-04-20 — End: 2014-04-23
  Administered 2014-04-20 – 2014-04-23 (×9): 5000 [IU] via SUBCUTANEOUS
  Filled 2014-04-20 (×13): qty 1

## 2014-04-20 MED ORDER — VITAMIN B-1 100 MG PO TABS
100.0000 mg | ORAL_TABLET | Freq: Every day | ORAL | Status: DC
  Administered 2014-04-20 – 2014-04-23 (×4): 100 mg via ORAL
  Filled 2014-04-20 (×4): qty 1

## 2014-04-20 MED ORDER — CEFTRIAXONE SODIUM 1 G IJ SOLR
1.0000 g | INTRAMUSCULAR | Status: DC
  Administered 2014-04-20 – 2014-04-22 (×3): 1 g via INTRAVENOUS
  Filled 2014-04-20 (×3): qty 10

## 2014-04-20 MED ORDER — DILTIAZEM HCL ER COATED BEADS 120 MG PO CP24
120.0000 mg | ORAL_CAPSULE | Freq: Every day | ORAL | Status: DC
  Administered 2014-04-20 – 2014-04-23 (×4): 120 mg via ORAL
  Filled 2014-04-20 (×4): qty 1

## 2014-04-20 MED ORDER — RISPERIDONE 0.5 MG PO TABS
0.5000 mg | ORAL_TABLET | Freq: Two times a day (BID) | ORAL | Status: DC
  Administered 2014-04-20 – 2014-04-23 (×7): 0.5 mg via ORAL
  Filled 2014-04-20 (×9): qty 1

## 2014-04-20 MED ORDER — VITAMIN D3 25 MCG (1000 UNIT) PO TABS
3000.0000 [IU] | ORAL_TABLET | Freq: Every morning | ORAL | Status: DC
  Administered 2014-04-20 – 2014-04-23 (×4): 3000 [IU] via ORAL
  Filled 2014-04-20 (×4): qty 3

## 2014-04-20 MED ORDER — SODIUM CHLORIDE 0.9 % IV SOLN
INTRAVENOUS | Status: DC
  Administered 2014-04-20: 02:00:00 via INTRAVENOUS

## 2014-04-20 MED ORDER — FOLIC ACID 1 MG PO TABS
1.0000 mg | ORAL_TABLET | Freq: Every day | ORAL | Status: DC
  Administered 2014-04-20 – 2014-04-23 (×4): 1 mg via ORAL
  Filled 2014-04-20 (×4): qty 1

## 2014-04-20 MED ORDER — ENSURE COMPLETE PO LIQD
237.0000 mL | Freq: Two times a day (BID) | ORAL | Status: DC
  Administered 2014-04-20 – 2014-04-22 (×3): 237 mL via ORAL

## 2014-04-20 MED ORDER — HYDROXYZINE HCL 50 MG PO TABS
50.0000 mg | ORAL_TABLET | Freq: Three times a day (TID) | ORAL | Status: DC | PRN
  Filled 2014-04-20: qty 1

## 2014-04-20 MED ORDER — LORAZEPAM 2 MG/ML IJ SOLN
INTRAMUSCULAR | Status: AC
  Filled 2014-04-20: qty 1

## 2014-04-20 MED ORDER — CLORAZEPATE DIPOTASSIUM 3.75 MG PO TABS
3.7500 mg | ORAL_TABLET | Freq: Three times a day (TID) | ORAL | Status: DC | PRN
  Administered 2014-04-20: 7.5 mg via ORAL
  Filled 2014-04-20 (×3): qty 2

## 2014-04-20 NOTE — Progress Notes (Signed)
INITIAL NUTRITION ASSESSMENT  DOCUMENTATION CODES Per approved criteria  -Not Applicable   INTERVENTION: - Ensure Complete BID - Assisted pt with ordering lunch - Recommend diet liberalization  - RD to continue to monitor  NUTRITION DIAGNOSIS: Inadequate oral intake related to dementia with worsening altered mental status, poor appetite as evidenced by 0% meal intake.   Goal: Pt to consume >90% of meals/supplements  Monitor:  Weights, labs, intake  Reason for Assessment: Malnutrition screening tool   80 y.o. female  Admitting Dx: Altered mental status   ASSESSMENT: [name] [name] is a 80 y.o. female with a history of dementia who presents with increased hallucinations both visual and auditory. Patients husband states that she has been more aggressive towards him. She has been more confused.  - Pt and husband in room, pt getting argumentative with husband - Husband reports pt hasn't been eating well at home other than good intake at breakfast - Weight down 10 pounds in the past 2 months - Pt agreeable to getting Ensure Complete  - Didn't eat any breakfast this morning   Height: Ht Readings from Last 1 Encounters:  02/01/14 5\' 5"  (1.651 m)    Weight: Wt Readings from Last 1 Encounters:  04/20/14 180 lb (81.647 kg)    Ideal Body Weight: 125 lbs   % Ideal Body Weight: 144%  Wt Readings from Last 10 Encounters:  04/20/14 180 lb (81.647 kg)  02/01/14 190 lb (86.183 kg)  01/05/14 191 lb 3.2 oz (86.728 kg)  12/13/13 187 lb 14.4 oz (85.231 kg)  05/21/13 192 lb 9.6 oz (87.363 kg)  03/05/13 192 lb 0.3 oz (87.1 kg)  02/26/13 192 lb (87.091 kg)  02/10/13 192 lb (87.091 kg)  04/07/12 199 lb 2 oz (90.323 kg)  02/27/10 207 lb (93.895 kg)    Usual Body Weight: 190 lbs 2 months ago   % Usual Body Weight: 95%  BMI:  Body mass index is 29.95 kg/(m^2).  Estimated Nutritional Needs: Kcal: 1400-1600 Protein: 55-70g Fluid: 1.4-1.6L/day   Skin: intact   Diet Order:  Cardiac  EDUCATION NEEDS: -No education needs identified at this time   Intake/Output Summary (Last 24 hours) at 04/20/14 1105 Last data filed at 04/20/14 0817  Gross per 24 hour  Intake    320 ml  Output      0 ml  Net    320 ml    Last BM: PTA  Labs:   Recent Labs Lab 04/19/14 1901 04/20/14 0535  NA 142 143  K 4.2 4.1  CL 100 103  CO2 28 29  BUN 29* 22  CREATININE 1.03 0.83  CALCIUM 9.7 9.3  GLUCOSE 120* 87    CBG (last 3)   Recent Labs  04/19/14 1937 04/20/14 0736  GLUCAP 100* 87    Scheduled Meds: . aspirin EC  81 mg Oral Daily  . cefTRIAXone (ROCEPHIN)  IV  1 g Intravenous Q24H  . cholecalciferol  3,000 Units Oral q morning - 10a  . diltiazem  120 mg Oral Daily  . famotidine  40 mg Oral Daily  . folic acid  1 mg Oral Daily  . heparin  5,000 Units Subcutaneous 3 times per day  . meloxicam  7.5 mg Oral BID  . multivitamin with minerals  1 tablet Oral Daily  . pravastatin  40 mg Oral q1800  . risperiDONE  0.5 mg Oral BID  . thiamine  100 mg Oral Daily    Continuous Infusions: . sodium chloride 50 mL/hr  at 04/20/14 0228    Past Medical History  Diagnosis Date  . B12 deficiency   . HTN (hypertension)   . DDD (degenerative disc disease)   . Supraventricular tachycardia   . Iron deficiency anemia   . HLD (hyperlipidemia)   . Osteoporosis   . Chronic anticoagulation   . CAD (coronary artery disease)     CFX PTCA '96,LAD stent in '05, cath '07  . Choledocholithiasis   . History of pancreatitis   . Diverticulosis   . Hiatal hernia   . Hx of adenomatous colonic polyps   . Pulmonary emboli 2007  . Abnormality of gait 02/01/2014  . Memory disorder 02/01/2014  . Diplopia 02/01/2014  . HOH (hard of hearing)     Past Surgical History  Procedure Laterality Date  . Kyphosis surgery    . Vertebroplasty  2007  . Laminectomy    . Cholecystectomy  2007  . Cardiac catheterization  '96, 8/04,11/05, 1/07  . Ercp with sphincterotomy  2007  .  Thyroidectomy, partial  1950    goiter  . Dilation and curettage of uterus  1955  . Cystoscopy  1977  . Abdominal hysterectomy    . Knee arthroscopy Right 2001    Dr. Laqueta DueHarkins    Jefte Carithers MS, RD, LDN [phone] Pager [phone] Weekend/After Hours Pager

## 2014-04-20 NOTE — Progress Notes (Signed)
Patient seen and evaluated earlier this a.m. Please refer to H and P for details regarding care plan.  We'll plan on reassessing next a.m.  [name] [name], [employer]

## 2014-04-20 NOTE — H&P (Signed)
[facility] History and Physical  [name] [name] ZOX:[id number] DOB: January 26, 1923 DOA: 04/19/2014  Referring physician: Pricilla LovelessScott Goldston, MD PCP: Nadean CorwinMCKEOWN,WILLIAM DAVID, MD   Chief Complaint: AMS  HPI: [name] [name] is a 80 y.o. female with a history of dementia who presents with increased hallucinations both visual and auditory. Patients husband states that she has been more aggressive towards him. She has been more confused. She has told him to get lost. Patient does have a baseline dementia but is usually fairly functional. She is able to get around the house with a walker. She has not had any headaches or dizziness no CP has been noted. She has no fevers noted. She was noted to have low saturations in the ED and a CT scan of the chest shows no pulmonary embolism. She also was noted to have a UTI which may explain her worsening symptoms.   Review of Systems:  She is not able to participate in ROS  Past Medical History  Diagnosis Date  . B12 deficiency   . HTN (hypertension)   . DDD (degenerative disc disease)   . Supraventricular tachycardia   . Iron deficiency anemia   . HLD (hyperlipidemia)   . Osteoporosis   . Chronic anticoagulation   . CAD (coronary artery disease)     CFX PTCA '96,LAD stent in '05, cath '07  . Choledocholithiasis   . History of pancreatitis   . Diverticulosis   . Hiatal hernia   . Hx of adenomatous colonic polyps   . Pulmonary emboli 2007  . Abnormality of gait 02/01/2014  . Memory disorder 02/01/2014  . Diplopia 02/01/2014  . HOH (hard of hearing)    Past Surgical History  Procedure Laterality Date  . Kyphosis surgery    . Vertebroplasty  2007  . Laminectomy    . Cholecystectomy  2007  . Cardiac catheterization  '96, 8/04,11/05, 1/07  . Ercp with sphincterotomy  2007  . Thyroidectomy, partial  1950    goiter  . Dilation and curettage of uterus  1955  . Cystoscopy  1977  . Abdominal hysterectomy    . Knee arthroscopy Right 2001    Dr. Ollen BowlHarkins    Social History:  reports that she has never smoked. She has never used smokeless tobacco. She reports that she does not drink alcohol or use illicit drugs.  No Known Allergies  Family History  Problem Relation Age of Onset  . Stroke Father   . Stroke Brother   . Cancer Brother     prostate  . Diabetes Son   . Colon cancer Neg Hx   . Dementia Sister      Prior to Admission medications   Medication Sig Start Date End Date Taking? Authorizing Provider  acetaminophen (TYLENOL) 500 MG tablet Take 1,000 mg by mouth every 6 (six) hours as needed for moderate pain.   Yes Historical Provider, MD  aspirin 81 MG tablet Take 162 mg by mouth daily.   Yes Historical Provider, MD  bumetanide (BUMEX) 1 MG tablet Take 1 mg by mouth daily.   Yes Historical Provider, MD  Cholecalciferol (VITAMIN D-3 PO) Take 4,000 Units by mouth daily.    Yes Historical Provider, MD  clorazepate (TRANXENE) 7.5 MG tablet Take 3.75-7.5 mg by mouth 3 (three) times daily as needed for anxiety.   Yes Historical Provider, MD  diltiazem (CARDIZEM CD) 120 MG 24 hr capsule Take 1 capsule (120 mg total) by mouth daily. 03/11/14  Yes Thurmon FairMihai Croitoru, MD  famotidine (  PEPCID) 40 MG tablet Take 40 mg by mouth daily.   Yes Historical Provider, MD  MAGNESIUM PO Take 1 tablet by mouth daily.   Yes Historical Provider, MD  meloxicam (MOBIC) 7.5 MG tablet Take 7.5 mg by mouth 2 (two) times daily.   Yes Historical Provider, MD  pravastatin (PRAVACHOL) 40 MG tablet Take 1 tablet (40 mg total) by mouth daily. 03/15/14  Yes Lucky Cowboy, MD  risperiDONE (RISPERDAL) 0.5 MG tablet Take 0.5 mg by mouth daily. 04/14/14  Yes York Spaniel, MD  dicyclomine (BENTYL) 20 MG tablet Take 20 mg by mouth daily as needed. 12/02/13   Historical Provider, MD   Physical Exam: Filed Vitals:   04/19/14 1823 04/19/14 2049 04/19/14 2210 04/19/14 2335  BP: 153/73 158/77 143/57   Pulse: 91 65 68   Temp: 97.8 F (36.6 C)     TempSrc: Oral     Resp: 18 16  18    SpO2: 90% 100% 100% 98%    Wt Readings from Last 3 Encounters:  02/01/14 86.183 kg (190 lb)  01/05/14 86.728 kg (191 lb 3.2 oz)  12/13/13 85.231 kg (187 lb 14.4 oz)    General:  Appears calm and comfortable and confused Eyes: PERRL, normal lids, irises ENT: grossly hearing decreased Neck: no LAD, masses or thyromegaly Cardiovascular: RRR, no m/r/g. No LE edema. Respiratory: CTA bilaterally, no w/r/r. Normal respiratory effort. Abdomen: soft, ntnd Skin: no rash or induration seen on limited exam Musculoskeletal: grossly normal tone BUE/BLE Psychiatric: appears confused not oriented Neurologic: grossly non-focal on limited exam          Labs on Admission:  Basic Metabolic Panel:  Recent Labs Lab 04/19/14 1901  NA 142  K 4.2  CL 100  CO2 28  GLUCOSE 120*  BUN 29*  CREATININE 1.03  CALCIUM 9.7   Liver Function Tests:  Recent Labs Lab 04/19/14 1901  AST 12  ALT 11  ALKPHOS 59  BILITOT 0.4  PROT 6.6  ALBUMIN 3.6   No results found for this basename: LIPASE, AMYLASE,  in the last 168 hours  Recent Labs Lab 04/19/14 1900  AMMONIA 19   CBC:  Recent Labs Lab 04/19/14 1901  WBC 9.5  NEUTROABS 7.1  HGB 14.2  HCT 44.2  MCV 84.5  PLT 230   Cardiac Enzymes: No results found for this basename: CKTOTAL, CKMB, CKMBINDEX, TROPONINI,  in the last 168 hours  BNP (last 3 results)  Recent Labs  04/19/14 1901  PROBNP 87.2   CBG:  Recent Labs Lab 04/19/14 1937  GLUCAP 100*    Radiological Exams on Admission: Dg Chest 2 View  04/19/2014   CLINICAL DATA:  Hallucinations. Hypoxia. History of pulmonary emboli. Hypertension.  EXAM: CHEST  2 VIEW  COMPARISON:  01/18/2014  FINDINGS: Prominently elevated right hemidiaphragm, as before. Tortuous thoracic aorta. Right heart is obscured by the prominent elevation of the right hemidiaphragm.  Chronic atelectasis along the elevated hemidiaphragm.  Subsegmental atelectasis or scarring along the left  hemidiaphragm. No new or acute findings.  IMPRESSION: 1. Stable chest with prominently elevated right hemidiaphragm with adjacent atelectasis, and subsegmental atelectasis or scarring along the left hemidiaphragm.   Electronically Signed   By: Herbie Baltimore M.D.   On: 04/19/2014 20:24   Ct Head Wo Contrast  04/19/2014   CLINICAL DATA:  Altered mental status.  Hallucinations  EXAM: CT HEAD WITHOUT CONTRAST  TECHNIQUE: Contiguous axial images were obtained from the base of the skull through the vertex without  intravenous contrast.  COMPARISON:  CT head 01/18/2014  FINDINGS: Mild atrophy. Mild chronic microvascular ischemic change in the white matter, stable.  Negative for acute infarct.  Negative for hemorrhage or mass.  Mucosal thickening in the sphenoid sinus bilaterally with bony thickening compatible with chronic sinusitis.  IMPRESSION: Generalized atrophy and mild chronic microvascular ischemia. No acute abnormality  Chronic sinusitis.   Electronically Signed   By: Marlan Palau M.D.   On: 04/19/2014 20:40   Ct Angio Chest W/cm &/or Wo Cm  04/19/2014   CLINICAL DATA:  Hypoxia.  History of pulmonary embolism.  EXAM: CT ANGIOGRAPHY CHEST WITH CONTRAST  TECHNIQUE: Multidetector CT imaging of the chest was performed using the standard protocol during bolus administration of intravenous contrast. Multiplanar CT image reconstructions and MIPs were obtained to evaluate the vascular anatomy.  CONTRAST:  OMNIPAQUE IOHEXOL 350 MG/ML SOLN  COMPARISON:  Radiographs today. Abdominal CT 05/04/2013 and chest CT 10/22/2005.  FINDINGS: Vascular: The pulmonary arteries are well opacified with contrast. There is no evidence of acute pulmonary embolism. There is atherosclerosis of the aorta, great vessels and coronary arteries.  Mediastinum: There are no enlarged mediastinal, hilar or axillary lymph nodes. 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 The trachea and esophagus demonstrate no significant findings. The  heart size is normal.  Lungs/Pleura: There is chronic elevation of the right hemidiaphragm which is progressive compared with the prior CT. There is associated progressive right greater than left basilar atelectasis. No airspace disease or endobronchial lesion is seen. There is no significant pleural effusion. A small pericardial effusion is stable.  Upper abdomen: Chronic but progressive elevation of the right hemidiaphragm. Stable mild extrahepatic biliary dilatation status post cholecystectomy.  Musculoskeletal/Chest wall: No chest wall lesion or acute osseous findings.  Review of the MIP images confirms the above findings.  IMPRESSION: 1. No evidence of acute pulmonary embolism. 2. No acute chest findings demonstrated. 3. Progressive elevation of the right hemidiaphragm compared with prior CT from 2007. There is associated chronic right-greater-than-left basilar atelectasis which appears mildly progressive. 4. Moderate atherosclerosis.   Electronically Signed   By: Roxy Horseman M.D.   On: 04/19/2014 23:29     Assessment/Plan Active Problems:   Altered mental status   Dementia   1. Altered Mental Status -she is likely having an exacerbation due to underlying urinary infection -husband states that he has not been able to care for her and she has been more combative -may need psych evaluation and neuro assessment  2. Dementia -will continue with her home medications -appears to have worsened over the course of the last few days per family  3. UTI -will start on antibiotics -will adequately hydrate  4. Elevated Glucose -will monitor CBG -check A1C     Code Status: Full Code (must indicate code status--if unknown or must be presumed, indicate so) DVT Prophylaxis:Heparin Family Communication: Husband (indicate person spoken with, if applicable, with phone number if by telephone) Disposition Plan: May Need Placement (indicate anticipated LOS)  Time spent:  [facility] Pager [phone]  **Disclaimer: This note may have been dictated with voice recognition software. Similar sounding words can inadvertently be transcribed and this note may contain transcription errors which may not have been corrected upon publication of note.**

## 2014-04-21 LAB — GLUCOSE, CAPILLARY
GLUCOSE-CAPILLARY: 102 mg/dL — AB (ref 70–99)
Glucose-Capillary: 94 mg/dL (ref 70–99)
Glucose-Capillary: 106 mg/dL — ABNORMAL HIGH (ref 70–99)

## 2014-04-21 MED ORDER — HALOPERIDOL LACTATE 5 MG/ML IJ SOLN
0.5000 mg | Freq: Once | INTRAMUSCULAR | Status: AC
  Administered 2014-04-21: 0.5 mg via INTRAVENOUS

## 2014-04-21 MED ORDER — HALOPERIDOL LACTATE 5 MG/ML IJ SOLN
INTRAMUSCULAR | Status: AC
  Filled 2014-04-21: qty 1

## 2014-04-21 NOTE — Progress Notes (Signed)
Offered spouse beverage, patient sleeping at this time

## 2014-04-21 NOTE — Progress Notes (Signed)
Patient's spouse in room, attempted to get her to eat breakfast.  He was successful in assisting in getting her to take her morning medications.  We discussed his thoughts on her living status after discharge and he stated he wished to keep her at home as long as he was able to care for her.

## 2014-04-21 NOTE — Progress Notes (Signed)
[facility] PROGRESS NOTE  [name] [name] FAO:[id number] DOB: [birth year] DOA: 04/19/2014 PCP: Nadean Corwin, MD  Assessment/Plan: Active Problems:   Altered mental status - suspecting it is 2ary to infectious etiology although it may be due to progressive dementia - Will continue IV antibiotics and reassess next am.    Dementia - continue home regimen. May be contributing to principle problem listed above. - Consult psychiatry  UTI - continue current antibiotic regimen   Code Status: full Family Communication: discussed with husband Disposition Plan: Pending further improvement in condition   Consultants:  psychiatry  Procedures:  None  Antibiotics:  Ceftriaxone  HPI/Subjective: No new complaints. Patient has had difficulty with staff and husband worries  Objective: Filed Vitals:   04/21/14 1400  BP: 156/75  Pulse: 65  Temp: 97.5 F (36.4 C)  Resp: 22    Intake/Output Summary (Last 24 hours) at 04/21/14 1455 Last data filed at 04/21/14 1400  Gross per 24 hour  Intake    240 ml  Output    301 ml  Net    -61 ml   Filed Weights   04/20/14 0110  Weight: 81.647 kg (180 lb)    Exam:   General:  Pt resting comfortably, arrousable  Cardiovascular: no cyanotic extremities, no upper extremity edema  Respiratory: no wheezes, no increased wob  Abdomen: soft, nt, nd  Musculoskeletal: no cyanosis   Data Reviewed: Basic Metabolic Panel:  Recent Labs Lab 04/19/14 1901 04/20/14 0535  NA 142 143  K 4.2 4.1  CL 100 103  CO2 28 29  GLUCOSE 120* 87  BUN 29* 22  CREATININE 1.03 0.83  CALCIUM 9.7 9.3   Liver Function Tests:  Recent Labs Lab 04/19/14 1901 04/20/14 0535  AST 12 11  ALT 11 9  ALKPHOS 59 53  BILITOT 0.4 0.5  PROT 6.6 5.9*  ALBUMIN 3.6 3.3*   No results found for this basename: LIPASE, AMYLASE,  in the last 168 hours  Recent Labs Lab 04/19/14 1900  AMMONIA 19   CBC:  Recent Labs Lab 04/19/14 1901  04/20/14 0535  WBC 9.5 9.4  NEUTROABS 7.1  --   HGB 14.2 13.8  HCT 44.2 43.7  MCV 84.5 85.5  PLT 230 217   Cardiac Enzymes: No results found for this basename: CKTOTAL, CKMB, CKMBINDEX, TROPONINI,  in the last 168 hours BNP (last 3 results)  Recent Labs  04/19/14 1901  PROBNP 87.2   CBG:  Recent Labs Lab 04/19/14 1937 04/20/14 0736 04/20/14 1156 04/21/14 0750 04/21/14 1150  GLUCAP 100* 87 105* 94 102*    Recent Results (from the past 240 hour(s))  URINE CULTURE     Status: None   Collection Time    04/19/14  9:04 PM      Result Value Ref Range Status   Specimen Description URINE, CATHETERIZED   Final   Special Requests NONE   Final   Culture  Setup Time     Final   Value: 04/20/2014 04:09     Performed at [facility] Count PENDING   Incomplete   Culture     Final   Value: Culture reincubated for better growth     Performed at [facility]   Report Status PENDING   Incomplete     Studies: Dg Chest 2 View  04/19/2014   CLINICAL DATA:  Hallucinations. Hypoxia. History of pulmonary emboli. Hypertension.  EXAM: CHEST  2 VIEW  COMPARISON:  01/18/2014  FINDINGS:  Prominently elevated right hemidiaphragm, as before. Tortuous thoracic aorta. Right heart is obscured by the prominent elevation of the right hemidiaphragm.  Chronic atelectasis along the elevated hemidiaphragm.  Subsegmental atelectasis or scarring along the left hemidiaphragm. No new or acute findings.  IMPRESSION: 1. Stable chest with prominently elevated right hemidiaphragm with adjacent atelectasis, and subsegmental atelectasis or scarring along the left hemidiaphragm.   Electronically Signed   By: Herbie BaltimoreWalt  Liebkemann M.D.   On: 04/19/2014 20:24   Ct Head Wo Contrast  04/19/2014   CLINICAL DATA:  Altered mental status.  Hallucinations  EXAM: CT HEAD WITHOUT CONTRAST  TECHNIQUE: Contiguous axial images were obtained from the base of the skull through the vertex without intravenous  contrast.  COMPARISON:  CT head 01/18/2014  FINDINGS: Mild atrophy. Mild chronic microvascular ischemic change in the white matter, stable.  Negative for acute infarct.  Negative for hemorrhage or mass.  Mucosal thickening in the sphenoid sinus bilaterally with bony thickening compatible with chronic sinusitis.  IMPRESSION: Generalized atrophy and mild chronic microvascular ischemia. No acute abnormality  Chronic sinusitis.   Electronically Signed   By: Marlan Palauharles  Clark M.D.   On: 04/19/2014 20:40   Ct Angio Chest W/cm &/or Wo Cm  04/19/2014   CLINICAL DATA:  Hypoxia.  History of pulmonary embolism.  EXAM: CT ANGIOGRAPHY CHEST WITH CONTRAST  TECHNIQUE: Multidetector CT imaging of the chest was performed using the standard protocol during bolus administration of intravenous contrast. Multiplanar CT image reconstructions and MIPs were obtained to evaluate the vascular anatomy.  CONTRAST:  100mL OMNIPAQUE IOHEXOL 350 MG/ML SOLN  COMPARISON:  Radiographs today. Abdominal CT 05/04/2013 and chest CT 10/22/2005.  FINDINGS: Vascular: The pulmonary arteries are well opacified with contrast. There is no evidence of acute pulmonary embolism. There is atherosclerosis of the aorta, great vessels and coronary arteries.  Mediastinum: There are no enlarged mediastinal, hilar or axillary lymph nodes. 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 The trachea and esophagus demonstrate no significant findings. The heart size is normal.  Lungs/Pleura: There is chronic elevation of the right hemidiaphragm which is progressive compared with the prior CT. There is associated progressive right greater than left basilar atelectasis. No airspace disease or endobronchial lesion is seen. There is no significant pleural effusion. A small pericardial effusion is stable.  Upper abdomen: Chronic but progressive elevation of the right hemidiaphragm. Stable mild extrahepatic biliary dilatation status post cholecystectomy.  Musculoskeletal/Chest wall:  No chest wall lesion or acute osseous findings.  Review of the MIP images confirms the above findings.  IMPRESSION: 1. No evidence of acute pulmonary embolism. 2. No acute chest findings demonstrated. 3. Progressive elevation of the right hemidiaphragm compared with prior CT from 2007. There is associated chronic right-greater-than-left basilar atelectasis which appears mildly progressive. 4. Moderate atherosclerosis.   Electronically Signed   By: Roxy HorsemanBill  Veazey M.D.   On: 04/19/2014 23:29    Scheduled Meds: . aspirin EC  81 mg Oral Daily  . cefTRIAXone (ROCEPHIN)  IV  1 g Intravenous Q24H  . cholecalciferol  3,000 Units Oral q morning - 10a  . diltiazem  120 mg Oral Daily  . famotidine  40 mg Oral Daily  . feeding supplement (ENSURE COMPLETE)  237 mL Oral BID BM  . folic acid  1 mg Oral Daily  . heparin  5,000 Units Subcutaneous 3 times per day  . LORazepam  0.5 mg Intravenous Once  . meloxicam  7.5 mg Oral BID  . multivitamin with minerals  1  tablet Oral Daily  . pravastatin  40 mg Oral q1800  . risperiDONE  0.5 mg Oral BID  . thiamine  100 mg Oral Daily   Continuous Infusions: . sodium chloride Stopped (04/20/14 2329)    Time spent: > 35 minutes    Penny Pia  [facility] Pager [phone] If 7PM-7AM, please contact night-coverage at [email or web address], password [facility] 04/21/2014, 2:55 PM  LOS: 2 days

## 2014-04-21 NOTE — Progress Notes (Signed)
Patient attempting to get out of bed.  Attempted to reorient patient as to her surroundings to no avail.  She is calling out for [place] and Lois and declines food and drink when offered.  Repositioned in bed, bed alarm initiated

## 2014-04-21 NOTE — Progress Notes (Signed)
[name] [name], NT and myself attempted to assist patient out of bed to bedside commode.  The patient became verbally abusive and physically combative with us and refused to get out of bed.  She kicks/slaps at staff and spouse ordering us all out of her room.  Spouse attempted to get her to take her tranxene but she refused. Order obtained and gave haldol 0.5mg  IV.  Bed alarm still activated.

## 2014-04-22 LAB — URINE CULTURE

## 2014-04-22 LAB — GLUCOSE, CAPILLARY
GLUCOSE-CAPILLARY: 100 mg/dL — AB (ref 70–99)
GLUCOSE-CAPILLARY: 118 mg/dL — AB (ref 70–99)

## 2014-04-22 MED ORDER — CEFDINIR 300 MG PO CAPS: 300.0000 mg | ORAL_CAPSULE | Freq: Two times a day (BID) | ORAL | Status: AC

## 2014-04-22 NOTE — Care Management Note (Unsigned)
    Page 1 of 1   04/22/2014     4:06:24 PM CARE MANAGEMENT NOTE 04/22/2014  Patient:  [name] [name],[name] [name]   Account Number:  [id number]  Date Initiated:  04/21/2014  Documentation initiated by:  [facility]Averiana Clouatre  Subjective/Objective Assessment:   80 year old female admitted with UTI.     Action/Plan:   From home with husband.   Anticipated DC Date:  04/25/2014   Anticipated DC Plan:  SKILLED NURSING FACILITY  In-house referral  Clinical Social Worker      DC Planning Services  CM consult      Choice offered to / List presented to:             Status of service:  In process, will continue to follow Medicare Important Message given?  YES (If response is "NO", the following Medicare IM given date fields will be blank) Date Medicare IM given:  04/22/2014 Medicare IM given by:  [facility]Iyana Topor Date Additional Medicare IM given:   Additional Medicare IM given by:    Discharge Disposition:    Per UR Regulation:  Reviewed for med. necessity/level of care/duration of stay  If discussed at Long Length of Stay Meetings, dates discussed:    Comments:

## 2014-04-22 NOTE — Evaluation (Signed)
Physical Therapy Evaluation Patient Details Name: [name] [name] MRN: [id number] DOB: [birth year] Today's Date: 04/22/2014   History of Present Illness  Pt is an 80 year old female admitted with increased hallucinations both visual and auditory, altered mental status, dementia and UTI with hx of abnormality of gait, DDD, memory disorder, diplopia, PE, CAD and pt and spouse reports previous R femur fx with nonsurgical management.  Clinical Impression  Pt admitted with altered mental status. Pt currently with functional limitations due to the deficits listed below (see PT Problem List).  Pt will benefit from skilled PT to increase their independence and safety with mobility to allow discharge to the venue listed below.  Pt currently +2 max assist for mobility and recommend ST-SNF prior to home as spouse unable to manage pt at current mobility level (states she would need to be supervision level; no physical assist).    Follow Up Recommendations SNF    Equipment Recommendations  None recommended by PT    Recommendations for Other Services       Precautions / Restrictions Precautions Precautions: Fall      Mobility  Bed Mobility Overal bed mobility: Needs Assistance;+2 for physical assistance Bed Mobility: Supine to Sit;Sit to Supine     Supine to sit: Max assist;+2 for physical assistance Sit to supine: Max assist;+2 for physical assistance   General bed mobility comments: verbal cues for technique, pt unable to physically assist requiring +2 assist for upper and lower body  Transfers Overall transfer level: Needs assistance Equipment used: 2 person hand held assist Transfers: Sit to/from Stand Sit to Stand: Mod assist;+2 physical assistance         General transfer comment: pt assisted to standing twice, LEs buckling, unsteady, with second standing pt assisted with taking a few steps up [facility] also unsteady with LEs buckling - did not appear safe to ambulate at this time (+2  assist from RN)  Ambulation/Gait                Stairs            Wheelchair Mobility    Modified Rankin (Stroke Patients Only)       Balance                                             Pertinent Vitals/Pain Pain in bil LEs to touch - chronic, moved hand placement to heels to assist with bed mobility    Home Living Family/patient expects to be discharged to:: Skilled nursing facility     Type of Home: House         Home Equipment: Dan HumphreysWalker - 2 wheels;Walker - 4 wheels;Bedside commode      Prior Function Level of Independence: Needs assistance   Gait / Transfers Assistance Needed: supervision for mobility, able to ambulate to/from bathroom with RW           Hand Dominance        Extremity/Trunk Assessment   Upper Extremity Assessment: Generalized weakness           Lower Extremity Assessment: Generalized weakness         Communication   Communication: HOH  Cognition Arousal/Alertness: Awake/alert Behavior During Therapy: WFL for tasks assessed/performed Overall Cognitive Status: History of cognitive impairments - at baseline  General Comments      Exercises        Assessment/Plan    PT Assessment Patient needs continued PT services  PT Diagnosis Difficulty walking;Generalized weakness   PT Problem List Decreased strength;Decreased activity tolerance;Decreased mobility;Decreased balance;Decreased knowledge of use of DME  PT Treatment Interventions Gait training;DME instruction;Balance training;Functional mobility training;Therapeutic activities;Therapeutic exercise;Patient/family education   PT Goals (Current goals can be found in the Care Plan section) Acute Rehab PT Goals PT Goal Formulation: With patient/family Time For Goal Achievement: 04/29/14 Potential to Achieve Goals: Good    Frequency Min 3X/week   Barriers to discharge        Co-evaluation                End of Session   Activity Tolerance: Patient tolerated treatment well Patient left: in bed;with call bell/phone within reach;with bed alarm set Nurse Communication: Mobility status         Time: 1435-1454 PT Time Calculation (min): 19 min   Charges:   PT Evaluation $Initial PT Evaluation Tier I: 1 Procedure PT Treatments $Therapeutic Activity: 8-22 mins   PT G Codes:          Shawneequa Baldridge,KATHrine E 04/22/2014, 3:40 PM Zenovia Jarred, PT, DPT 04/22/2014 Pager: [phone]

## 2014-04-22 NOTE — Discharge Summary (Addendum)
Physician Discharge Summary  [name] [name]:[id number] DOB: 10-Mar-1923 DOA: 04/19/2014  PCP: Nadean Corwin, MD  Admit date: 04/19/2014 Discharge date: 04/22/2014  Time spent: > 35 minutes  Recommendations for Outpatient Follow-up:  1. Please be sure to follow up with your primary care physician in 1-2 weeks or sooner should any new concerns arise.  Discharge Diagnoses:  Active Problems:   Altered mental status   Dementia   Discharge Condition: stable  Diet recommendation: Low sodium heart healthy  Filed Weights   04/20/14 0110  Weight: 81.647 kg (180 lb)    History of present illness:  From original HPI: [name] [name] is a 80 y.o. female with a history of dementia who presents with increased hallucinations both visual and auditory. Patients husband states that she has been more aggressive towards him. She has been more confused  Patient found to have UTI  Hospital Course:  Active Problems:  Altered mental status  - suspecting it is 2ary to infectious etiology although it may be due to progressive dementia  - improved with antibiotics - Patient debilitated and will require physical therapy at SNF per PT recommendations.  Dementia  - continue home regimen. May be contributing to principle problem listed above.   UTI  - discharge on oral cephalosporin for 4 more days to complete a 7 day total regimen   Procedures:  None  Consultations:  None  Discharge Exam: Filed Vitals:   04/22/14 1458  BP: 143/85  Pulse: 81  Temp: 97.7 F (36.5 C)  Resp: 18    General: Pt in nad, alert and awake Cardiovascular: rrr, no mrg Respiratory: cta bl , no wheezes  Discharge Instructions You were cared for by a hospitalist during your hospital stay. If you have any questions about your discharge medications or the care you received while you were in the hospital after you are discharged, you can call the unit and asked to speak with the hospitalist on call if the  hospitalist that took care of you is not available. Once you are discharged, your primary care physician will handle any further medical issues. Please note that NO REFILLS for any discharge medications will be authorized once you are discharged, as it is imperative that you return to your primary care physician (or establish a relationship with a primary care physician if you do not have one) for your aftercare needs so that they can reassess your need for medications and monitor your lab values.  Discharge Instructions   Call MD for:  redness, tenderness, or signs of infection (pain, swelling, redness, odor or [place]/yellow discharge around incision site)    Complete by:  As directed      Call MD for:  temperature >100.4    Complete by:  As directed      Diet - low sodium heart healthy    Complete by:  As directed      Increase activity slowly    Complete by:  As directed             Medication List         acetaminophen 500 MG tablet  Commonly known as:  TYLENOL  Take 1,000 mg by mouth every 6 (six) hours as needed for moderate pain.     aspirin 81 MG tablet  Take 162 mg by mouth daily.     bumetanide 1 MG tablet  Commonly known as:  BUMEX  Take 1 mg by mouth daily.     cefdinir 300  MG capsule  Commonly known as:  OMNICEF  Take 1 capsule (300 mg total) by mouth 2 (two) times daily.     clorazepate 7.5 MG tablet  Commonly known as:  TRANXENE  Take 3.75-7.5 mg by mouth 3 (three) times daily as needed for anxiety.     dicyclomine 20 MG tablet  Commonly known as:  BENTYL  Take 20 mg by mouth daily as needed.     diltiazem 120 MG 24 hr capsule  Commonly known as:  CARDIZEM CD  Take 1 capsule (120 mg total) by mouth daily.     famotidine 40 MG tablet  Commonly known as:  PEPCID  Take 40 mg by mouth daily.     MAGNESIUM PO  Take 1 tablet by mouth daily.     meloxicam 7.5 MG tablet  Commonly known as:  MOBIC  Take 7.5 mg by mouth 2 (two) times daily.     pravastatin  40 MG tablet  Commonly known as:  PRAVACHOL  Take 1 tablet (40 mg total) by mouth daily.     risperiDONE 0.5 MG tablet  Commonly known as:  RISPERDAL  Take 0.5 mg by mouth daily.     VITAMIN D-3 PO  Take 4,000 Units by mouth daily.       No Known Allergies    The results of significant diagnostics from this hospitalization (including imaging, microbiology, ancillary and laboratory) are listed below for reference.    Significant Diagnostic Studies: Dg Chest 2 View  04/19/2014   CLINICAL DATA:  Hallucinations. Hypoxia. History of pulmonary emboli. Hypertension.  EXAM: CHEST  2 VIEW  COMPARISON:  01/18/2014  FINDINGS: Prominently elevated right hemidiaphragm, as before. Tortuous thoracic aorta. Right heart is obscured by the prominent elevation of the right hemidiaphragm.  Chronic atelectasis along the elevated hemidiaphragm.  Subsegmental atelectasis or scarring along the left hemidiaphragm. No new or acute findings.  IMPRESSION: 1. Stable chest with prominently elevated right hemidiaphragm with adjacent atelectasis, and subsegmental atelectasis or scarring along the left hemidiaphragm.   Electronically Signed   By: Herbie BaltimoreWalt  Liebkemann M.D.   On: 04/19/2014 20:24   Ct Head Wo Contrast  04/19/2014   CLINICAL DATA:  Altered mental status.  Hallucinations  EXAM: CT HEAD WITHOUT CONTRAST  TECHNIQUE: Contiguous axial images were obtained from the base of the skull through the vertex without intravenous contrast.  COMPARISON:  CT head 01/18/2014  FINDINGS: Mild atrophy. Mild chronic microvascular ischemic change in the white matter, stable.  Negative for acute infarct.  Negative for hemorrhage or mass.  Mucosal thickening in the sphenoid sinus bilaterally with bony thickening compatible with chronic sinusitis.  IMPRESSION: Generalized atrophy and mild chronic microvascular ischemia. No acute abnormality  Chronic sinusitis.   Electronically Signed   By: Marlan Palauharles  Clark M.D.   On: 04/19/2014 20:40   Ct  Angio Chest W/cm &/or Wo Cm  04/19/2014   CLINICAL DATA:  Hypoxia.  History of pulmonary embolism.  EXAM: CT ANGIOGRAPHY CHEST WITH CONTRAST  TECHNIQUE: Multidetector CT imaging of the chest was performed using the standard protocol during bolus administration of intravenous contrast. Multiplanar CT image reconstructions and MIPs were obtained to evaluate the vascular anatomy.  CONTRAST:  100mL OMNIPAQUE IOHEXOL 350 MG/ML SOLN  COMPARISON:  Radiographs today. Abdominal CT 05/04/2013 and chest CT 10/22/2005.  FINDINGS: Vascular: The pulmonary arteries are well opacified with contrast. There is no evidence of acute pulmonary embolism. There is atherosclerosis of the aorta, great vessels and coronary arteries.  Mediastinum:  There are no enlarged mediastinal, hilar or axillary lymph nodes. 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 The trachea and esophagus demonstrate no significant findings. The heart size is normal.  Lungs/Pleura: There is chronic elevation of the right hemidiaphragm which is progressive compared with the prior CT. There is associated progressive right greater than left basilar atelectasis. No airspace disease or endobronchial lesion is seen. There is no significant pleural effusion. A small pericardial effusion is stable.  Upper abdomen: Chronic but progressive elevation of the right hemidiaphragm. Stable mild extrahepatic biliary dilatation status post cholecystectomy.  Musculoskeletal/Chest wall: No chest wall lesion or acute osseous findings.  Review of the MIP images confirms the above findings.  IMPRESSION: 1. No evidence of acute pulmonary embolism. 2. No acute chest findings demonstrated. 3. Progressive elevation of the right hemidiaphragm compared with prior CT from 2007. There is associated chronic right-greater-than-left basilar atelectasis which appears mildly progressive. 4. Moderate atherosclerosis.   Electronically Signed   By: Roxy Horseman M.D.   On: 04/19/2014 23:29     Microbiology: Recent Results (from the past 240 hour(s))  URINE CULTURE     Status: None   Collection Time    04/19/14  9:04 PM      Result Value Ref Range Status   Specimen Description URINE, CATHETERIZED   Final   Special Requests NONE   Final   Culture  Setup Time     Final   Value: 04/20/2014 04:09     Performed at [facility] Count     Final   Value: >=100,000 COLONIES/ML     Performed at [facility]   Culture     Final   Value: ESCHERICHIA COLI     Performed at [facility]   Report Status 04/22/2014 FINAL   Final   Organism ID, Bacteria ESCHERICHIA COLI   Final     Labs: Basic Metabolic Panel:  Recent Labs Lab 04/19/14 1901 04/20/14 0535  NA 142 143  K 4.2 4.1  CL 100 103  CO2 28 29  GLUCOSE 120* 87  BUN 29* 22  CREATININE 1.03 0.83  CALCIUM 9.7 9.3   Liver Function Tests:  Recent Labs Lab 04/19/14 1901 04/20/14 0535  AST 12 11  ALT 11 9  ALKPHOS 59 53  BILITOT 0.4 0.5  PROT 6.6 5.9*  ALBUMIN 3.6 3.3*   No results found for this basename: LIPASE, AMYLASE,  in the last 168 hours  Recent Labs Lab 04/19/14 1900  AMMONIA 19   CBC:  Recent Labs Lab 04/19/14 1901 04/20/14 0535  WBC 9.5 9.4  NEUTROABS 7.1  --   HGB 14.2 13.8  HCT 44.2 43.7  MCV 84.5 85.5  PLT 230 217   Cardiac Enzymes: No results found for this basename: CKTOTAL, CKMB, CKMBINDEX, TROPONINI,  in the last 168 hours BNP: BNP (last 3 results)  Recent Labs  04/19/14 1901  PROBNP 87.2   CBG:  Recent Labs Lab 04/21/14 0750 04/21/14 1150 04/21/14 1659 04/22/14 0730 04/22/14 1122  GLUCAP 94 102* 106* 100* 118*       Signed:  Penny Pia  [facility] 04/22/2014, 3:34 PM    Patient ok for discharge to SNF with continued physical therapy

## 2014-04-22 NOTE — Progress Notes (Addendum)
Clinical Social Work Department CLINICAL SOCIAL WORK PLACEMENT NOTE 04/22/2014  Patient:  [name] [name],[name] [name]  Account Number:  [id number] Admit date:  04/19/2014  Clinical Social Worker:  Unk LightningHOLLY Shloimy Michalski, LCSW  Date/time:  04/22/2014 03:00 PM  Clinical Social Work is seeking post-discharge placement for this patient at the following level of care:   SKILLED NURSING   (*CSW will update this form in Epic as items are completed)   04/22/2014  [facility]/family provided with [facility] Department of Clinical Social Work'[name] list of facilities offering this level of care within the geographic area requested by the patient (or if unable, by the patient'[name] family).  04/22/2014  Patient/family informed of their freedom to choose among providers that offer the needed level of care, that participate in Medicare, Medicaid or managed care program needed by the patient, have an available bed and are willing to accept the patient.  04/22/2014  Patient/family informed of [facility]' ownership interest in [facility], as well as of the fact that they are under no obligation to receive care at this facility.  PASARR submitted to EDS on existing # PASARR number received on   FL2 transmitted to all facilities in geographic area requested by pt/family on  04/22/2014 FL2 transmitted to all facilities within larger geographic area on   Patient informed that his/her managed care company has contracts with or will negotiate with  certain facilities, including the following:     Patient/family informed of bed offers received:  04/22/14 [facility] Physician recommends and patient chooses bed at    [facility] on  04/23/14 Patient to be transferred to facility by [facility] Patient and family notified of transfer on 04/23/14 Name of family member notified:  Omer JackHusband-Herman  The following physician request were entered in Epic:   Additional  Comments:

## 2014-04-22 NOTE — Progress Notes (Addendum)
Clinical Social Work Department BRIEF PSYCHOSOCIAL ASSESSMENT 04/22/2014  Patient:  [name] [name], [name] [name]     Account Number:  [id number]     Admit date:  04/19/2014  Clinical Social Worker:  [name] [name]  Date/Time:  04/22/2014 03:00 PM  Referred by:  Physician  Date Referred:  04/22/2014 Referred for  SNF Placement   Other Referral:   Interview type:  Patient Other interview type:    PSYCHOSOCIAL DATA Living Status:  FAMILY Admitted from facility:   Level of care:   Primary support name:  [name] [name] Primary support relationship to patient:  SPOUSE Degree of support available:   Strong    CURRENT CONCERNS Current Concerns  Post-Acute Placement   Other Concerns:    SOCIAL WORK ASSESSMENT / PLAN CSW received referral in order to assist with DC planning. CSW reviewed chart and met with patient, husband, and son at bedside. CSW introduced myself and explained role.    Patient was living at home with husband prior to admission. Patient recently worked with PT and struggled with ambulating. Husband and patient agreeable to ST SNF in order for patient to regain strength prior to returning home. CSW provided SNF list and explained DC process. [facility] was at [name] and would prefer to return if able but agreeable to [name] search if [name] is unable to accept.    CSW completed FL2 and faxed out. CSW spoke with [name] who will call CSW once they have determined if they can accept patient.   Assessment/plan status:  Psychosocial Support/Ongoing Assessment of Needs Other assessment/ plan:   Information/referral to community resources:   SNF list    PATIENT'S/FAMILY'S RESPONSE TO PLAN OF CARE: Patient alert but family is more engaged during assessment. Husband did not want to send patient to SNF but is worried about patient's safety. Patient reports she liked [name] in the past and is hopeful that they would accept her again. Son thanked CSW for  assistance and does not feel that patient can manage at home with husband.       [name] [name], [name] [name] [phone]   Addendum: [name] is able to make a bed offer. CSW informed family and husband went to SNF to complete paperwork. CSW faxed preliminary DC summary to SNF and leave handoff for weekend CSW. Weekend CSW to contact [facility] for weekend admission. Family prefers [facility] to provide transportation. Signed FL2 on chart.

## 2014-04-23 LAB — GLUCOSE, CAPILLARY: GLUCOSE-CAPILLARY: 105 mg/dL — AB (ref 70–99)

## 2014-04-23 NOTE — Progress Notes (Signed)
Per MD, Pt ready for d/c.  Notified RN, Pt, family (Pt's husband, Mr. Chilton SiGreen, at bedside) and facility.  Sent d/c summary.  Confirmed receipt of d/c summary.  Facility ready to receive Pt.  Arranged for transportation.  Providence CrosbyAmanda Araeya Lamb, LCSWA Clinical Social Work [phone]

## 2014-04-25 ENCOUNTER — Telehealth: Payer: Self-pay | Admitting: Neurology

## 2014-04-25 NOTE — Telephone Encounter (Signed)
FYI----Sps wanted to make Dr. Anne HahnWillis aware patient was admitted to hospital from 7/21 - 7/25 for UTI.

## 2014-04-25 NOTE — Telephone Encounter (Signed)
Events noted, the patient went to the hospital with increased confusion, found to have a urinary tract infection. Just recently discharged. I did not call the husband.

## 2014-04-25 NOTE — Telephone Encounter (Signed)
Patient's husband just wanted you to know she had been hospitalized for UTI.

## 2014-04-26 ENCOUNTER — Telehealth: Payer: Self-pay | Admitting: Neurology

## 2014-04-26 LAB — GLUCOSE, CAPILLARY
GLUCOSE-CAPILLARY: 132 mg/dL — AB (ref 70–99)
Glucose-Capillary: 161 mg/dL — ABNORMAL HIGH (ref 70–99)

## 2014-04-26 NOTE — Telephone Encounter (Signed)
I called the patient, left a message. I have full access to all information from the recent hospitalization. The patient was felt to have a bladder infection, with increased confusion. If he needs anything, he is to contact our office.

## 2014-04-26 NOTE — Telephone Encounter (Signed)
Patient's husband dropped of lab results of his wife's for Dr. Willis to look over. He would like a call back as he has some questions. Please call to advise.  °

## 2014-04-26 NOTE — Telephone Encounter (Signed)
Patient's husband dropped of lab results of his wife's for Dr. Anne HahnWillis to look over. He would like a call back as he has some questions. Please call to advise.

## 2014-07-26 ENCOUNTER — Ambulatory Visit: Payer: Self-pay | Admitting: Internal Medicine

## 2014-07-31 DEATH — deceased

## 2014-08-04 ENCOUNTER — Ambulatory Visit: Payer: Medicare Other | Admitting: Adult Health

## 2015-01-09 ENCOUNTER — Encounter: Payer: Self-pay | Admitting: Internal Medicine
